# Patient Record
Sex: Male | Born: 1970
Health system: Southern US, Community
[De-identification: ages and names within clinical notes are randomized; demographics above are authoritative.]

## PROBLEM LIST (undated history)

## (undated) DIAGNOSIS — E785 Hyperlipidemia, unspecified: Secondary | ICD-10-CM

## (undated) DIAGNOSIS — R000 Tachycardia, unspecified: Secondary | ICD-10-CM

## (undated) DIAGNOSIS — F419 Anxiety disorder, unspecified: Secondary | ICD-10-CM

## (undated) DIAGNOSIS — N200 Calculus of kidney: Secondary | ICD-10-CM

## (undated) DIAGNOSIS — M1990 Unspecified osteoarthritis, unspecified site: Secondary | ICD-10-CM

## (undated) DIAGNOSIS — T7840XA Allergy, unspecified, initial encounter: Secondary | ICD-10-CM

## (undated) DIAGNOSIS — G4730 Sleep apnea, unspecified: Secondary | ICD-10-CM

## (undated) DIAGNOSIS — I10 Essential (primary) hypertension: Secondary | ICD-10-CM

## (undated) HISTORY — PX: WISDOM TOOTH EXTRACTION: SHX21

## (undated) HISTORY — DX: Hyperlipidemia, unspecified: E78.5

## (undated) HISTORY — DX: Allergy, unspecified, initial encounter: T78.40XA

## (undated) HISTORY — PX: COLONOSCOPY: SHX174

## (undated) HISTORY — DX: Sleep apnea, unspecified: G47.30

## (undated) HISTORY — DX: Tachycardia, unspecified: R00.0

## (undated) HISTORY — DX: Anxiety disorder, unspecified: F41.9

## (undated) HISTORY — DX: Essential (primary) hypertension: I10

## (undated) HISTORY — DX: Unspecified osteoarthritis, unspecified site: M19.90

---

## 2000-12-12 ENCOUNTER — Encounter: Payer: Self-pay | Admitting: Emergency Medicine

## 2000-12-12 ENCOUNTER — Emergency Department (HOSPITAL_COMMUNITY): Admission: EM | Admit: 2000-12-12 | Discharge: 2000-12-13 | Payer: Self-pay | Admitting: Emergency Medicine

## 2003-06-21 HISTORY — PX: VASECTOMY: SHX75

## 2005-11-25 ENCOUNTER — Ambulatory Visit: Payer: Self-pay | Admitting: Gastroenterology

## 2005-12-19 ENCOUNTER — Encounter (INDEPENDENT_AMBULATORY_CARE_PROVIDER_SITE_OTHER): Payer: Self-pay | Admitting: *Deleted

## 2005-12-19 ENCOUNTER — Ambulatory Visit: Payer: Self-pay | Admitting: Gastroenterology

## 2007-01-09 ENCOUNTER — Ambulatory Visit (HOSPITAL_BASED_OUTPATIENT_CLINIC_OR_DEPARTMENT_OTHER): Admission: RE | Admit: 2007-01-09 | Discharge: 2007-01-09 | Payer: Self-pay | Admitting: Internal Medicine

## 2007-01-14 ENCOUNTER — Ambulatory Visit: Payer: Self-pay | Admitting: Internal Medicine

## 2009-12-13 ENCOUNTER — Emergency Department (HOSPITAL_BASED_OUTPATIENT_CLINIC_OR_DEPARTMENT_OTHER): Admission: EM | Admit: 2009-12-13 | Discharge: 2009-12-13 | Payer: Self-pay | Admitting: Emergency Medicine

## 2010-11-02 NOTE — Procedures (Signed)
NAME:  Jacob Cabrera, Jacob Cabrera NO.:  [id number]   MEDICAL RECORD NO.:  [id number]          PATIENT TYPE:  OUT   LOCATION:  SLEEP CENTER                 FACILITY:  [facility]   PHYSICIAN:  Clinton D. Maple Hudson, MD, FCCP, FACPDATE OF BIRTH:  30-Mar-1971   DATE OF STUDY:  01/09/2007                            NOCTURNAL POLYSOMNOGRAM   REFERRING PHYSICIAN:  Jonita Albee, M.D.   INDICATION FOR STUDY:  Hypersomnia with sleep apnea.   EPWORTH SLEEPINESS SCORE:  15/24, BMI 30, weight 227 pounds.   MEDICATIONS:  Home medications are listed and reviewed. Diagnostic NPSG  protocol was requested.   SLEEP ARCHITECTURE:  Total sleep time 355 minutes with sleep efficiency  89%. Stage 1 was 4%, stage 2 was 53%, stage 3 was 23%, REM 20% of total  sleep time. Sleep latency 7 minutes, REM latency 114 minutes. Awake  after sleep onset 36 minutes. Arousal index 11.3. No bedtime medication  was taken.   RESPIRATORY DATA:  Apnea-hypopnea index (AHI,RDI) 1.5 obstructive events  per hour which is within normal limits (normal range 0-5 per hour).  There were 2 obstructive apneas and 7 hypopneas. Most events were  recorded while sleeping supine. REM AHI 0.   OXYGEN DATA:  Moderate snoring with oxygen desaturation to a nadir of  88%. Mean oxygen saturation through the study was 94% on room air.   CARDIAC DATA:  Normal sinus rhythm.   MOVEMENT-PARASOMNIA:  Rare limb jerk, insignificant.   IMPRESSIONS-RECOMMENDATIONS:  1. Unremarkable sleep architecture.  2. Occasional respiratory event, within normal limits, AHI 1.5 per      hour (normal range 0-5 per hour). Apneas and hypopneas were more      common while supine and it may be beneficial to try sleeping off      flat of back.      Clinton D. Maple Hudson, MD, [facility], FACP  Diplomate, Biomedical engineer of Sleep Medicine  Electronically Signed    CDY/MEDQ  D:  01/14/2007 10:48:40  T:  01/15/2007 07:54:00  Job:  [id number]

## 2011-03-09 ENCOUNTER — Encounter: Payer: Self-pay | Admitting: Gastroenterology

## 2011-05-16 ENCOUNTER — Encounter: Payer: Self-pay | Admitting: Gastroenterology

## 2011-05-16 ENCOUNTER — Ambulatory Visit (AMBULATORY_SURGERY_CENTER): Payer: 59 | Admitting: *Deleted

## 2011-05-16 VITALS — Ht 72.0 in | Wt 256.6 lb

## 2011-05-16 DIAGNOSIS — Z1211 Encounter for screening for malignant neoplasm of colon: Secondary | ICD-10-CM

## 2011-05-16 MED ORDER — PEG-KCL-NACL-NASULF-NA ASC-C 100 G PO SOLR: ORAL | Status: DC

## 2011-05-30 ENCOUNTER — Encounter: Payer: Self-pay | Admitting: Gastroenterology

## 2011-05-30 ENCOUNTER — Ambulatory Visit (AMBULATORY_SURGERY_CENTER): Payer: 59 | Admitting: Gastroenterology

## 2011-05-30 DIAGNOSIS — Z83719 Family history of colon polyps, unspecified: Secondary | ICD-10-CM | POA: Insufficient documentation

## 2011-05-30 DIAGNOSIS — Z8371 Family history of colonic polyps: Secondary | ICD-10-CM

## 2011-05-30 DIAGNOSIS — Z1211 Encounter for screening for malignant neoplasm of colon: Secondary | ICD-10-CM

## 2011-05-30 MED ORDER — SODIUM CHLORIDE 0.9 % IV SOLN: 500.0000 mL | INTRAVENOUS | Status: DC

## 2011-05-30 NOTE — Op Note (Signed)
[facility] [address] [place], [place]  [address]  COLONOSCOPY PROCEDURE REPORT  PATIENT:  Jacob Cabrera, Jacob Cabrera  MR#:  [id number] BIRTHDATE:  March 20, 1971, 40 yrs. old  GENDER:  male ENDOSCOPIST:  Vania Rea. Jarold Motto, MD, [facility] REF. BY: PROCEDURE DATE:  05/30/2011 PROCEDURE:  Higher-risk screening colonoscopy G0105  ASA CLASS:  Class I INDICATIONS:  family Hx of polyps MEDICATIONS:   Fentanyl 75 mcg IV, Versed 7 mg IV, These medications were titrated to patient response per physician's verbal order  DESCRIPTION OF PROCEDURE:   After the risks and benefits and of the procedure were explained, informed consent was obtained. Digital rectal exam was performed and revealed no abnormalities. The LB 180AL [id number] endoscope was introduced through the anus and advanced to the cecum, which was identified by both the appendix and ileocecal valve.  The quality of the prep was excellent, using MoviPrep.  The instrument was then slowly withdrawn as the colon was fully examined. <<PROCEDUREIMAGES>>  FINDINGS:  No polyps or cancers were seen.  This was otherwise a normal examination of the colon.   Retroflexed views in the rectum revealed no abnormalities.    The scope was then withdrawn from the patient and the procedure completed.  COMPLICATIONS:  None ENDOSCOPIC IMPRESSION: 1) No polyps or cancers 2) Otherwise normal examination RECOMMENDATIONS: 1) Given your significant family history of colon cancer, you should have a repeat colonoscopy in 5 years  REPEAT EXAM:  No  ______________________________ Vania Rea. Jarold Motto, MD, Clementeen Graham  CC:  Robert Bellow, MD  n. Rosalie Doctor:   Vania Rea. Patterson at 05/30/2011 09:24 AM  Allena Napoleon, [phone]

## 2011-05-30 NOTE — Progress Notes (Signed)
Patient did not experience any of the following events: a burn prior to discharge; a fall within the facility; wrong site/side/patient/procedure/implant event; or a hospital transfer or hospital admission upon discharge from the facility. (G8907) Patient did not have preoperative order for IV antibiotic SSI prophylaxis. (G8918)  

## 2011-05-30 NOTE — Progress Notes (Signed)
Pt tolerated the colonoscopy very well. maw 

## 2011-05-30 NOTE — Patient Instructions (Signed)
Discharge instructions given with verbal understanding.  Normal exam.  Resume previous medications. 

## 2011-05-31 ENCOUNTER — Telehealth: Payer: Self-pay | Admitting: *Deleted

## 2011-05-31 NOTE — Telephone Encounter (Signed)
No answer. Message left. 

## 2011-07-03 ENCOUNTER — Encounter (INDEPENDENT_AMBULATORY_CARE_PROVIDER_SITE_OTHER): Payer: 59 | Admitting: Physician Assistant

## 2011-07-03 DIAGNOSIS — J45909 Unspecified asthma, uncomplicated: Secondary | ICD-10-CM

## 2011-07-03 DIAGNOSIS — F458 Other somatoform disorders: Secondary | ICD-10-CM

## 2011-08-22 ENCOUNTER — Ambulatory Visit (INDEPENDENT_AMBULATORY_CARE_PROVIDER_SITE_OTHER): Payer: 59 | Admitting: Physician Assistant

## 2011-08-22 VITALS — BP 130/90 | HR 84 | Temp 98.9°F | Resp 18 | Ht 71.5 in | Wt 258.0 lb

## 2011-08-22 DIAGNOSIS — J019 Acute sinusitis, unspecified: Secondary | ICD-10-CM

## 2011-08-22 DIAGNOSIS — J9801 Acute bronchospasm: Secondary | ICD-10-CM

## 2011-08-22 DIAGNOSIS — R05 Cough: Secondary | ICD-10-CM

## 2011-08-22 DIAGNOSIS — J40 Bronchitis, not specified as acute or chronic: Secondary | ICD-10-CM

## 2011-08-22 MED ORDER — AZITHROMYCIN 250 MG PO TABS: ORAL_TABLET | ORAL | Status: AC

## 2011-08-22 MED ORDER — HYDROCODONE-HOMATROPINE 5-1.5 MG/5ML PO SYRP: ORAL_SOLUTION | ORAL | Status: AC

## 2011-08-22 MED ORDER — IPRATROPIUM BROMIDE 0.06 % NA SOLN: 2.0000 | Freq: Three times a day (TID) | NASAL | Status: DC

## 2011-08-22 MED ORDER — ALBUTEROL SULFATE (2.5 MG/3ML) 0.083% IN NEBU
2.5000 mg | INHALATION_SOLUTION | Freq: Once | RESPIRATORY_TRACT | Status: AC
  Administered 2011-08-22: 2.5 mg via RESPIRATORY_TRACT

## 2011-08-22 NOTE — Progress Notes (Signed)
Patient ID: Jacob Cabrera MRN: [id number], DOB: 1970/10/14, 41 y.o. Date of Encounter: 08/22/2011, 9:07 AM  Primary Physician: Tally Due, MD, MD  Chief Complaint:  Chief Complaint  Patient presents with  . Cough    since last wednesday  . Sore Throat  . Sinusitis    HPI: 41 y.o. year old male presents with 6 day history of nasal congestion, sinus pressure, post nasal drip, and cough. Afebrile. No chills. Nasal congestion thick and green. Cough is now productive of green sputum. Cough not associated with time of day. Bilateral hearing muffled. No GI complaints. No wheezing or SOB.  At baseline asthma is well controlled, not requiring the usage of his inhaler. With his current illness he is only needing his inhaler a couple times per day. No asthma flare.  No leg trauma, sedentary periods, h/o cancer, or tobacco use.  Past Medical History  Diagnosis Date  . Rapid heart rate   . Anxiety   . Asthma      Home Meds: Prior to Admission medications   Medication Sig Start Date End Date Taking? Authorizing Provider  Albuterol (VENTOLIN IN) Inhale into the lungs. 2 puffs before exercise    Yes Historical Provider, MD  ASMANEX 60 METERED DOSES 220 MCG/INH inhaler Inhale 1 puff into the lungs daily.  05/06/11  Yes Historical Provider, MD  metoprolol succinate (TOPROL-XL) 25 MG 24 hr tablet Take 25 mg by mouth daily.  05/06/11  Yes Historical Provider, MD  PARoxetine (PAXIL-CR) 25 MG 24 hr tablet Take 25 mg by mouth every morning.  05/06/11  Yes Historical Provider, MD    Allergies:  Allergies  Allergen Reactions  . Penicillins     Unspecified, as child    History   Social History  . Marital Status: Married    Spouse Name: N/A    Number of Children: N/A  . Years of Education: N/A   Occupational History  . Not on file.   Social History Main Topics  . Smoking status: Never Smoker   . Smokeless tobacco: Never Used  . Alcohol Use: Yes     occasional  . Drug Use: No    . Sexually Active: Not on file   Other Topics Concern  . Not on file   Social History Narrative  . No narrative on file     Review of Systems: Constitutional: negative for chills, fever, night sweats or weight changes Cardiovascular: negative for chest pain or palpitations Respiratory: negative for hemoptysis, wheezing, or shortness of breath Abdominal: negative for abdominal pain, nausea, vomiting or diarrhea Dermatological: negative for rash Neurologic: negative for headache   Physical Exam: Blood pressure 130/90, pulse 84, temperature 98.9 F (37.2 C), temperature source Oral, resp. rate 18, height 5' 11.5" (1.816 m), weight 258 lb (117.028 kg)., Body mass index is 35.48 kg/(m^2). General: Well developed, well nourished, in no acute distress. Head: Normocephalic, atraumatic, eyes without discharge, sclera non-icteric, nares are congested. Bilateral auditory canals clear, TM's are without perforation, pearly grey with reflective cone of light bilaterally. Serous effusion bilaterally behind TM's. Maxillary sinus TTP. Oral cavity moist, dentition normal. Posterior pharynx with post nasal drip and mild erythema. No peritonsillar abscess or tonsillar exudate. Neck: Supple. No thyromegaly. Full ROM. No lymphadenopathy. Lungs: Coarse breath sounds without wheezes, rales, or rhonchi. Breathing is unlabored. Heart: RRR with S1 S2. No murmurs, rubs, or gallops appreciated. Msk:  Strength and tone normal for age. Extremities: No clubbing or cyanosis. No edema. Neuro: Alert  and oriented X 3. Moves all extremities spontaneously. CNII-XII grossly in tact. Psych:  Responds to questions appropriately with a normal affect.     ASSESSMENT AND PLAN:  41 y.o. year old male with sinobronchitis and bronchospasm. -Azithromycin 250 MG #6 2 po first day then 1 po next 4 days no RF -Atrovent NS 0.06% 2 sprays each nare bid prn #1 no RF -Hycodan #4oz 1 tsp po q 4-6 hours prn cough no RF  SED -Mucinex -He does not currently need a refill of his albuterol inhaler.  -Continue albuterol inhaler q 4-6 hours prn -Tylenol/Motrin prn -Rest/fluids -RTC precautions -RTC 3-5 days if no improvement  Signed, Eula Listen, PA-C 08/22/2011 9:07 AM

## 2011-09-13 ENCOUNTER — Ambulatory Visit (INDEPENDENT_AMBULATORY_CARE_PROVIDER_SITE_OTHER): Payer: 59 | Admitting: Physician Assistant

## 2011-09-13 VITALS — BP 129/84 | HR 87 | Temp 97.9°F | Resp 16 | Ht 71.5 in | Wt 255.8 lb

## 2011-09-13 DIAGNOSIS — F411 Generalized anxiety disorder: Secondary | ICD-10-CM

## 2011-09-13 DIAGNOSIS — F419 Anxiety disorder, unspecified: Secondary | ICD-10-CM

## 2011-09-13 DIAGNOSIS — J309 Allergic rhinitis, unspecified: Secondary | ICD-10-CM

## 2011-09-13 MED ORDER — MONTELUKAST SODIUM 10 MG PO TABS: 10.0000 mg | ORAL_TABLET | Freq: Every day | ORAL | Status: DC

## 2011-09-13 MED ORDER — LORAZEPAM 1 MG PO TABS: 1.0000 mg | ORAL_TABLET | Freq: Four times a day (QID) | ORAL | Status: DC | PRN

## 2011-09-13 NOTE — Progress Notes (Signed)
Patient ID: Jacob Cabrera MRN: [id number], DOB: 1970-09-09, 41 y.o. Date of Encounter: 09/13/2011, 9:13 AM  Primary Physician: Tally Due, MD, MD  Chief Complaint: Medication refill   HPI: 41 y.o. year old male with history below presents for refill of Singulair and Ativan for up coming flight. Doing well without issues or complaints. Taking medication daily without adverse effects. Has been on medication for years. Really helps to control his allergies.  He requests Ativan for flying. He will be flying to [place] in 2 days to visit family. Gets anxious with flying stating "he is not in control." He previously taken Ativan prior to flights and done well with it. He requests a refill of the Ativan today.   He does not need any other refills today. Doing well with his anxiety at baseline. Well controled with Paxil CR.  Past Medical History  Diagnosis Date  . Rapid heart rate   . Anxiety   . Asthma      Home Meds: Prior to Admission medications   Medication Sig Start Date End Date Taking? Authorizing Provider  Albuterol (VENTOLIN IN) Inhale into the lungs. 2 puffs before exercise    Yes Historical Provider, MD  ASMANEX 60 METERED DOSES 220 MCG/INH inhaler Inhale 1 puff into the lungs daily.  05/06/11  Yes Historical Provider, MD  LORazepam (ATIVAN) 1 MG tablet Take 1 mg by mouth as needed. Take 1 tablet prior to flying   Yes Historical Provider, MD  metoprolol succinate (TOPROL-XL) 25 MG 24 hr tablet Take 25 mg by mouth daily.  05/06/11  Yes Historical Provider, MD  montelukast (SINGULAIR) 10 MG tablet Take 10 mg by mouth at bedtime.   Yes Historical Provider, MD  PARoxetine (PAXIL-CR) 25 MG 24 hr tablet Take 25 mg by mouth every morning.  05/06/11  Yes Historical Provider, MD  ipratropium (ATROVENT) 0.06 % nasal spray Place 2 sprays into the nose 3 (three) times daily. 08/22/11 08/21/12  Sondra Barges, PA-C    Allergies:  Allergies  Allergen Reactions  . Penicillins    Unspecified, as child    History   Social History  . Marital Status: Married    Spouse Name: N/A    Number of Children: N/A  . Years of Education: N/A   Occupational History  . Not on file.   Social History Main Topics  . Smoking status: Never Smoker   . Smokeless tobacco: Never Used  . Alcohol Use: Yes     occasional  . Drug Use: No  . Sexually Active: Not on file   Other Topics Concern  . Not on file   Social History Narrative  . No narrative on file     Review of Systems: Constitutional: negative for chills, fever, night sweats, weight changes, or fatigue  HEENT: negative for vision changes, hearing loss, congestion, rhinorrhea, ST, epistaxis, or sinus pressure Cardiovascular: negative for chest pain or palpitations Respiratory: negative for hemoptysis, wheezing, shortness of breath, or cough Abdominal: negative for abdominal pain, nausea, vomiting, diarrhea, or constipation Dermatological: negative for rash Neurologic: negative for headache, dizziness, or syncope All other systems reviewed and are otherwise negative with the exception to those above and in the HPI.   Physical Exam: Blood pressure 129/84, pulse 87, temperature 97.9 F (36.6 C), temperature source Oral, resp. rate 16, height 5' 11.5" (1.816 m), weight 255 lb 12.8 oz (116.03 kg)., Body mass index is 35.18 kg/(m^2). General: Well developed, well nourished, in no acute distress. Head:  Normocephalic, atraumatic, eyes without discharge, sclera non-icteric, nares are without discharge. Bilateral auditory canals clear, TM's are without perforation, pearly grey and translucent with reflective cone of light bilaterally. Oral cavity moist, posterior pharynx without exudate, erythema, peritonsillar abscess, or post nasal drip.  Neck: Supple. No thyromegaly. Full ROM. No lymphadenopathy. Lungs: Clear bilaterally to auscultation without wheezes, rales, or rhonchi. Breathing is unlabored. Heart: RRR with S1 S2. No  murmurs, rubs, or gallops appreciated. Msk:  Strength and tone normal for age. Extremities/Skin: Warm and dry. No clubbing or cyanosis. No edema. No rashes or suspicious lesions. Neuro: Alert and oriented X 3. Moves all extremities spontaneously. Gait is normal. CNII-XII grossly in tact. Psych:  Responds to questions appropriately with a normal affect.     ASSESSMENT AND PLAN:  41 y.o. year old male with allergic rhinitis and flight anxiety here for medication refill. 1. Allergic rhinitis -Refill Singulair 10 mg #30 1 po qhs RF 11  2. Flight anxiety -Refill Ativan 1 mg #30 1 po prior to flight. 1 po q 6 hours prn no RF  Signed, Eula Listen, PA-C 09/13/2011 9:13 AM

## 2011-11-18 ENCOUNTER — Ambulatory Visit (INDEPENDENT_AMBULATORY_CARE_PROVIDER_SITE_OTHER): Payer: 59 | Admitting: Family Medicine

## 2011-11-18 ENCOUNTER — Encounter: Payer: Self-pay | Admitting: Family Medicine

## 2011-11-18 VITALS — BP 128/90 | HR 80 | Temp 97.4°F | Resp 18 | Ht 71.0 in | Wt 253.0 lb

## 2011-11-18 DIAGNOSIS — I10 Essential (primary) hypertension: Secondary | ICD-10-CM | POA: Insufficient documentation

## 2011-11-18 DIAGNOSIS — E669 Obesity, unspecified: Secondary | ICD-10-CM | POA: Insufficient documentation

## 2011-11-18 DIAGNOSIS — F419 Anxiety disorder, unspecified: Secondary | ICD-10-CM | POA: Insufficient documentation

## 2011-11-18 DIAGNOSIS — E781 Pure hyperglyceridemia: Secondary | ICD-10-CM

## 2011-11-18 LAB — COMPREHENSIVE METABOLIC PANEL
Albumin: 4.7 g/dL (ref 3.5–5.2)
Alkaline Phosphatase: 56 U/L (ref 39–117)
BUN: 17 mg/dL (ref 6–23)
CO2: 24 mEq/L (ref 19–32)
Calcium: 9.4 mg/dL (ref 8.4–10.5)
Chloride: 105 mEq/L (ref 96–112)
Glucose, Bld: 86 mg/dL (ref 70–99)
Potassium: 3.8 mEq/L (ref 3.5–5.3)
Total Protein: 7.4 g/dL (ref 6.0–8.3)

## 2011-11-18 LAB — LIPID PANEL
HDL: 32 mg/dL — ABNORMAL LOW (ref >39)
Total CHOL/HDL Ratio: 5.4 Ratio
VLDL: 56 mg/dL — ABNORMAL HIGH (ref 0–40)

## 2011-11-18 MED ORDER — MOMETASONE FUROATE 220 MCG/INH IN AEPB: 1.0000 | INHALATION_SPRAY | Freq: Every day | RESPIRATORY_TRACT | Status: DC

## 2011-11-18 MED ORDER — METOPROLOL SUCCINATE ER 25 MG PO TB24: 25.0000 mg | ORAL_TABLET | Freq: Every day | ORAL | Status: DC

## 2011-11-18 MED ORDER — FLUTICASONE PROPIONATE 50 MCG/ACT NA SUSP: 2.0000 | Freq: Every day | NASAL | Status: DC

## 2011-11-18 MED ORDER — PAROXETINE HCL ER 25 MG PO TB24: 25.0000 mg | ORAL_TABLET | ORAL | Status: DC

## 2011-11-18 NOTE — Progress Notes (Signed)
  Subjective:    Patient ID: Jacob Cabrera, male    DOB: Dec 21, 1970, 41 y.o.   MRN: [id number]  HPI  This 41 y.o. Cauc male is here for med RFs and BP check. He is working out with a Psychologist, educational in an  effort to loss weight (goal=70 lbs). He is having no symptoms (ran out of Metoprolol yesterday).  Denies CP, HA, palpitations, dizziness, syncope,or weakness. He is going on vacation to beach next  week and will be scuba diving with sons.    Review of Systems As per HPI     Objective:   Physical Exam  Vitals reviewed. Constitutional: He is oriented to person, place, and time. He appears well-developed and well-nourished. No distress.  HENT:  Head: Normocephalic and atraumatic.  Eyes: EOM are normal. No scleral icterus.  Cardiovascular: Normal rate.   Pulmonary/Chest: Effort normal. No respiratory distress.  Neurological: He is alert and oriented to person, place, and time. No cranial nerve deficit. Coordination normal.  Skin: Skin is warm and dry.  Psychiatric: He has a normal mood and affect. His behavior is normal.          Assessment & Plan:   1. HTN (hypertension) - good control RFs: Metoprolol succinate 25 mg   #90   3 RFs Comprehensive metabolic panel  2. Hypertriglyceridemia  Lipid panel Continue to improve nutrition  3. Obesity  Reviewed importance of adequate Vit D for normal metabolism Vitamin D, 25-hydroxy   RFs for Fluticasone NS, Asmanex MDI, Paroxetine  RTC 4 months for CPE

## 2011-11-19 LAB — VITAMIN D 25 HYDROXY (VIT D DEFICIENCY, FRACTURES): Vit D, 25-Hydroxy: 33 ng/mL (ref 30–89)

## 2011-11-24 NOTE — Progress Notes (Signed)
Quick Note:  Please call pt and advise that the following labs are abnormal... Chemistries are normal. Triglycerides are too high and HDL("good") cholesterol is too low. Get Omega 3 Fish Oil 1200 mg 1 capsule daily will help improve HDL. Try to eliminate junk foods, fried foods and "processed" foods, eat more fruits and vegetables and stay active to help raise HDL and lower Triglycerides.  Vit D is borderline low; get OTC Vit D3 1000-2000 IU and take 1 capsules most days of the week.  Copy to pt. ______

## 2012-02-23 NOTE — Progress Notes (Signed)
This encounter was created in error - please disregard.

## 2012-03-30 ENCOUNTER — Ambulatory Visit (INDEPENDENT_AMBULATORY_CARE_PROVIDER_SITE_OTHER): Payer: 59 | Admitting: Family Medicine

## 2012-03-30 ENCOUNTER — Encounter: Payer: Self-pay | Admitting: Family Medicine

## 2012-03-30 VITALS — BP 130/84 | HR 71 | Temp 98.4°F | Resp 16 | Ht 71.0 in | Wt 251.0 lb

## 2012-03-30 DIAGNOSIS — E785 Hyperlipidemia, unspecified: Secondary | ICD-10-CM

## 2012-03-30 DIAGNOSIS — Z0000 Encounter for general adult medical examination without abnormal findings: Secondary | ICD-10-CM

## 2012-03-30 DIAGNOSIS — Z23 Encounter for immunization: Secondary | ICD-10-CM

## 2012-03-30 LAB — POCT URINALYSIS DIPSTICK
Bilirubin, UA: NEGATIVE
Ketones, UA: NEGATIVE
Nitrite, UA: NEGATIVE
Protein, UA: NEGATIVE
pH, UA: 5

## 2012-03-30 LAB — LIPID PANEL
HDL: 29 mg/dL — ABNORMAL LOW (ref >39)
LDL Cholesterol: 98 mg/dL (ref 0–99)
Total CHOL/HDL Ratio: 6 Ratio

## 2012-03-30 NOTE — Patient Instructions (Signed)
Keeping you healthy  Get these tests  Blood pressure- Have your blood pressure checked once a year by your healthcare provider.  Normal blood pressure is 120/80.  Weight- Have your body mass index (BMI) calculated to screen for obesity.  BMI is a measure of body fat based on height and weight. You can also calculate your own BMI at https://www.west-esparza.com/.  Cholesterol- Have your cholesterol checked regularly starting at age 41, sooner may be necessary if you have diabetes, high blood pressure, if a family member developed heart diseases at an early age or if you smoke.   Chlamydia, HIV, and other sexual transmitted disease- Get screened each year until the age of 3 then within three months of each new sexual partner.  Diabetes- Have your blood sugar checked regularly if you have high blood pressure, high cholesterol, a family history of diabetes or if you are overweight.  Get these vaccines  Flu shot- Every fall.  Tetanus shot- Every 10 years.  Menactra- Single dose; prevents meningitis.  Take these steps  Don't smoke- If you do smoke, ask your healthcare provider about quitting. For tips on how to quit, go to www.smokefree.gov or call 1-800-QUIT-NOW.  Be physically active- Exercise 5 days a week for at least 30 minutes.  If you are not already physically active start slow and gradually work up to 30 minutes of moderate physical activity.  Examples of moderate activity include walking briskly, mowing the yard, dancing, swimming bicycling, etc.  Eat a healthy diet- Eat a variety of healthy foods such as fruits, vegetables, low fat milk, low fat cheese, yogurt, lean meats, poultry, fish, beans, tofu, etc.  For more information on healthy eating, go to www.thenutritionsource.org  Drink alcohol in moderation- Limit alcohol intake two drinks or less a day.  Never drink and drive.  Dentist- Brush and floss teeth twice daily; visit your dentis twice a year.  Depression-Your emotional  health is as important as your physical health.  If you're feeling down, losing interest in things you normally enjoy please talk with your healthcare provider.  Gun Safety- If you keep a gun in your home, keep it unloaded and with the safety lock on.  Bullets should be stored separately.  Helmet use- Always wear a helmet when riding a motorcycle, bicycle, rollerblading or skateboarding.  Safe sex- If you may be exposed to a sexually transmitted infection, use a condom  Seat belts- Seat bels can save your life; always wear one.  Smoke/Carbon Monoxide detectors- These detectors need to be installed on the appropriate level of your home.  Replace batteries at least once a year.  Skin Cancer- When out in the sun, cover up and use sunscreen SPF 15 or higher.  Violence- If anyone is threatening or hurting you, please tell your healthcare provider.    Hypertriglyceridemia  Diet for High blood levels of Triglycerides Most fats in food are triglycerides. Triglycerides in your blood are stored as fat in your body. High levels of triglycerides in your blood may put you at a greater risk for heart disease and stroke.  Normal triglyceride levels are less than 150 mg/dL. Borderline high levels are 150-199 mg/dl. High levels are 200 - 499 mg/dL, and very high triglyceride levels are greater than 500 mg/dL. The decision to treat high triglycerides is generally based on the level. For people with borderline or high triglyceride levels, treatment includes weight loss and exercise. Drugs are recommended for people with very high triglyceride levels. Many people who need  treatment for high triglyceride levels have metabolic syndrome. This syndrome is a collection of disorders that often include: insulin resistance, high blood pressure, blood clotting problems, high cholesterol and triglycerides. TESTING PROCEDURE FOR TRIGLYCERIDES  You should not eat 4 hours before getting your triglycerides measured. The  normal range of triglycerides is between 10 and 250 milligrams per deciliter (mg/dl). Some people may have extreme levels (1000 or above), but your triglyceride level may be too high if it is above 150 mg/dl, depending on what other risk factors you have for heart disease.  People with high blood triglycerides may also have high blood cholesterol levels. If you have high blood cholesterol as well as high blood triglycerides, your risk for heart disease is probably greater than if you only had high triglycerides. High blood cholesterol is one of the main risk factors for heart disease. CHANGING YOUR DIET  Your weight can affect your blood triglyceride level. If you are more than 20% above your ideal body weight, you may be able to lower your blood triglycerides by losing weight. Eating less and exercising regularly is the best way to combat this. Fat provides more calories than any other food. The best way to lose weight is to eat less fat. Only 30% of your total calories should come from fat. Less than 7% of your diet should come from saturated fat. A diet low in fat and saturated fat is the same as a diet to decrease blood cholesterol. By eating a diet lower in fat, you may lose weight, lower your blood cholesterol, and lower your blood triglyceride level.  Eating a diet low in fat, especially saturated fat, may also help you lower your blood triglyceride level. Ask your dietitian to help you figure how much fat you can eat based on the number of calories your caregiver has prescribed for you.  Exercise, in addition to helping with weight loss may also help lower triglyceride levels.   Alcohol can increase blood triglycerides. You may need to stop drinking alcoholic beverages.  Too much carbohydrate in your diet may also increase your blood triglycerides. Some complex carbohydrates are necessary in your diet. These may include bread, rice, potatoes, other starchy vegetables and cereals.  Reduce "simple"  carbohydrates. These may include pure sugars, candy, honey, and jelly without losing other nutrients. If you have the kind of high blood triglycerides that is affected by the amount of carbohydrates in your diet, you will need to eat less sugar and less high-sugar foods. Your caregiver can help you with this.  Adding 2-4 grams of fish oil (EPA+ DHA) may also help lower triglycerides. Speak with your caregiver before adding any supplements to your regimen. Following the Diet  Maintain your ideal weight. Your caregivers can help you with a diet. Generally, eating less food and getting more exercise will help you lose weight. Joining a weight control group may also help. Ask your caregivers for a good weight control group in your area.  Eat low-fat foods instead of high-fat foods. This can help you lose weight too.  These foods are lower in fat. Eat MORE of these:   Dried beans, peas, and lentils.  Egg whites.  Low-fat cottage cheese.  Fish.  Lean cuts of meat, such as round, sirloin, rump, and flank (cut extra fat off meat you fix).  Whole grain breads, cereals and pasta.  Skim and nonfat dry milk.  Low-fat yogurt.  Poultry without the skin.  Cheese made with skim or part-skim milk,  such as mozzarella, parmesan, farmers', ricotta, or pot cheese. These are higher fat foods. Eat LESS of these:   Whole milk and foods made from whole milk, such as American, blue, cheddar, monterey jack, and swiss cheese  High-fat meats, such as luncheon meats, sausages, knockwurst, bratwurst, hot dogs, ribs, corned beef, ground pork, and regular ground beef.  Fried foods. Limit saturated fats in your diet. Substituting unsaturated fat for saturated fat may decrease your blood triglyceride level. You will need to read package labels to know which products contain saturated fats.  These foods are high in saturated fat. Eat LESS of these:   Fried pork skins.  Whole milk.  Skin and fat from  poultry.  Palm oil.  Butter.  Shortening.  Cream cheese.  Tomasa Blase.  Margarines and baked goods made from listed oils.  Vegetable shortenings.  Chitterlings.  Fat from meats.  Coconut oil.  Palm kernel oil.  Lard.  Cream.  Sour cream.  Fatback.  Coffee whiteners and non-dairy creamers made with these oils.  Cheese made from whole milk. Use unsaturated fats (both polyunsaturated and monounsaturated) moderately. Remember, even though unsaturated fats are better than saturated fats; you still want a diet low in total fat.  These foods are high in unsaturated fat:   Canola oil.  Sunflower oil.  Mayonnaise.  Almonds.  Peanuts.  Pine nuts.  Margarines made with these oils.  Safflower oil.  Olive oil.  Avocados.  Cashews.  Peanut butter.  Sunflower seeds.  Soybean oil.  Peanut oil.  Olives.  Pecans.  Walnuts.  Pumpkin seeds. Avoid sugar and other high-sugar foods. This will decrease carbohydrates without decreasing other nutrients. Sugar in your food goes rapidly to your blood. When there is excess sugar in your blood, your liver may use it to make more triglycerides. Sugar also contains calories without other important nutrients.  Eat LESS of these:   Sugar, brown sugar, powdered sugar, jam, jelly, preserves, honey, syrup, molasses, pies, candy, cakes, cookies, frosting, pastries, colas, soft drinks, punches, fruit drinks, and regular gelatin.  Avoid alcohol. Alcohol, even more than sugar, may increase blood triglycerides. In addition, alcohol is high in calories and low in nutrients. Ask for sparkling water, or a diet soft drink instead of an alcoholic beverage. Suggestions for planning and preparing meals   Bake, broil, grill or roast meats instead of frying.  Remove fat from meats and skin from poultry before cooking.  Add spices, herbs, lemon juice or vinegar to vegetables instead of salt, rich sauces or gravies.  Use a non-stick  skillet without fat or use no-stick sprays.  Cool and refrigerate stews and broth. Then remove the hardened fat floating on the surface before serving.  Refrigerate meat drippings and skim off fat to make low-fat gravies.  Serve more fish.  Use less butter, margarine and other high-fat spreads on bread or vegetables.  Use skim or reconstituted non-fat dry milk for cooking.  Cook with low-fat cheeses.  Substitute low-fat yogurt or cottage cheese for all or part of the sour cream in recipes for sauces, dips or congealed salads.  Use half yogurt/half mayonnaise in salad recipes.  Substitute evaporated skim milk for cream. Evaporated skim milk or reconstituted non-fat dry milk can be whipped and substituted for whipped cream in certain recipes.  Choose fresh fruits for dessert instead of high-fat foods such as pies or cakes. Fruits are naturally low in fat. When Dining Out   Order low-fat appetizers such as fruit or vegetable juice,  pasta with vegetables or tomato sauce.  Select clear, rather than cream soups.  Ask that dressings and gravies be served on the side. Then use less of them.  Order foods that are baked, broiled, poached, steamed, stir-fried, or roasted.  Ask for margarine instead of butter, and use only a small amount.  Drink sparkling water, unsweetened tea or coffee, or diet soft drinks instead of alcohol or other sweet beverages. QUESTIONS AND ANSWERS ABOUT OTHER FATS IN THE BLOOD: SATURATED FAT, TRANS FAT, AND CHOLESTEROL What is trans fat? Trans fat is a type of fat that is formed when vegetable oil is hardened through a process called hydrogenation. This process helps makes foods more solid, gives them shape, and prolongs their shelf life. Trans fats are also called hydrogenated or partially hydrogenated oils.  What do saturated fat, trans fat, and cholesterol in foods have to do with heart disease? Saturated fat, trans fat, and cholesterol in the diet all raise  the level of LDL "bad" cholesterol in the blood. The higher the LDL cholesterol, the greater the risk for coronary heart disease (CHD). Saturated fat and trans fat raise LDL similarly.  What foods contain saturated fat, trans fat, and cholesterol? High amounts of saturated fat are found in animal products, such as fatty cuts of meat, chicken skin, and full-fat dairy products like butter, whole milk, cream, and cheese, and in tropical vegetable oils such as palm, palm kernel, and coconut oil. Trans fat is found in some of the same foods as saturated fat, such as vegetable shortening, some margarines (especially hard or stick margarine), crackers, cookies, baked goods, fried foods, salad dressings, and other processed foods made with partially hydrogenated vegetable oils. Small amounts of trans fat also occur naturally in some animal products, such as milk products, beef, and lamb. Foods high in cholesterol include liver, other organ meats, egg yolks, shrimp, and full-fat dairy products. How can I use the new food label to make heart-healthy food choices? Check the Nutrition Facts panel of the food label. Choose foods lower in saturated fat, trans fat, and cholesterol. For saturated fat and cholesterol, you can also use the Percent Daily Value (%DV): 5% DV or less is low, and 20% DV or more is high. (There is no %DV for trans fat.) Use the Nutrition Facts panel to choose foods low in saturated fat and cholesterol, and if the trans fat is not listed, read the ingredients and limit products that list shortening or hydrogenated or partially hydrogenated vegetable oil, which tend to be high in trans fat. POINTS TO REMEMBER:   Discuss your risk for heart disease with your caregivers, and take steps to reduce risk factors.  Change your diet. Choose foods that are low in saturated fat, trans fat, and cholesterol.  Add exercise to your daily routine if it is not already being done. Participate in physical activity  of moderate intensity, like brisk walking, for at least 30 minutes on most, and preferably all days of the week. No time? Break the 30 minutes into three, 10-minute segments during the day.  Stop smoking. If you do smoke, contact your caregiver to discuss ways in which they can help you quit.  Do not use street drugs.  Maintain a normal weight.  Maintain a healthy blood pressure.  Keep up with your blood work for checking the fats in your blood as directed by your caregiver. Document Released: 03/24/2004 Document Revised: 12/06/2011 Document Reviewed: 10/20/2008 [employer] [facility] Patient Information 2013 [place], [place].

## 2012-04-01 NOTE — Progress Notes (Signed)
Quick Note:  Please call pt and advise that the following labs are abnormal... Lipid profile shows triglycerides have come down from 280 to 234. HDL (:good") cholesterol is still below 39. All other values are very good. Given your numbers, your risk of Cardiovascular Disease is a little above average. Get OTC Fish Oil supplement (Schiff MegaRed Krill Oil Omega-3 capsule is a good brand) and take 1 capsule daily, separate from your other medications. Continue regular exercise and carefully review the information you received at you visit re: Hypertriglycerides.  These values can be rechecked at your next Blood Pressure follow-up visit.  Copy to pt.   ______

## 2012-04-01 NOTE — Progress Notes (Signed)
  Subjective:    Patient ID: Jacob Cabrera, male    DOB: [birth year], 41 y.o.   MRN: [id number]  HPI  This 41 y.o. Cauc male is here for CPE and medication refills. He is doing well on current chronic   medications and has no side effects or new complaints. He is married and works for an [employer].  He exercises 3x / week for 45 minutes.    Review of Systems Noncontirbutory.     Objective:   Physical Exam  Nursing note and vitals reviewed. Constitutional: He is oriented to person, place, and time. He appears well-developed and well-nourished. No distress.  HENT:  Head: Normocephalic and atraumatic.  Right Ear: Hearing, tympanic membrane, external ear and ear canal normal.  Left Ear: Hearing, tympanic membrane, external ear and ear canal normal.  Nose: Nose normal. No nasal deformity or septal deviation.  Mouth/Throat: Uvula is midline, oropharynx is clear and moist and mucous membranes are normal. No oral lesions. Normal dentition. No dental caries.  Eyes: Conjunctivae normal, EOM and lids are normal. Pupils are equal, round, and reactive to light. No scleral icterus.  Fundoscopic exam:      The right eye shows no papilledema. The right eye shows red reflex.      The left eye shows no papilledema. The left eye shows red reflex. Neck: Normal range of motion. Neck supple. No thyromegaly present.  Cardiovascular: Normal rate, regular rhythm, normal heart sounds and intact distal pulses.  Exam reveals no gallop and no friction rub.   No murmur heard. Pulmonary/Chest: Effort normal and breath sounds normal. No respiratory distress. He has no wheezes.  Abdominal: Soft. Normal appearance and bowel sounds are normal. He exhibits no distension, no pulsatile midline mass and no mass. There is no hepatosplenomegaly. There is no tenderness. There is no guarding and no CVA tenderness. No hernia.  Genitourinary: Testes normal and penis normal. Right testis shows no mass, no swelling and no tenderness.  Right testis is descended. Left testis shows no mass, no swelling and no tenderness. Left testis is descended.  Musculoskeletal: Normal range of motion. He exhibits no edema and no tenderness.  Lymphadenopathy:    He has no cervical adenopathy.       Right: No inguinal adenopathy present.       Left: No inguinal adenopathy present.  Neurological: He is alert and oriented to person, place, and time. He has normal reflexes. No cranial nerve deficit. He exhibits normal muscle tone. Coordination normal.  Skin: Skin is warm and dry. No rash noted. No pallor.  Psychiatric: He has a normal mood and affect. His behavior is normal. Judgment and thought content normal.          Assessment & Plan:   1. Routine general medical examination at a health care facility  POCT urinalysis dipstick  2. Dyslipidemia  Lipid panel  3. Need for prophylactic vaccination and inoculation against influenza  Flu vaccine greater than or equal to 3yo preservative free IM

## 2012-05-08 ENCOUNTER — Ambulatory Visit (INDEPENDENT_AMBULATORY_CARE_PROVIDER_SITE_OTHER): Payer: 59 | Admitting: Family Medicine

## 2012-05-08 VITALS — BP 116/88 | HR 79 | Temp 98.7°F | Resp 16 | Ht 71.0 in | Wt 258.0 lb

## 2012-05-08 DIAGNOSIS — L42 Pityriasis rosea: Secondary | ICD-10-CM

## 2012-05-08 DIAGNOSIS — R21 Rash and other nonspecific skin eruption: Secondary | ICD-10-CM

## 2012-05-08 NOTE — Progress Notes (Signed)
[facility] [address], [place] [address] [phone]  Date:  05/08/2012   Name:  Jacob Cabrera   DOB:  05/12/1971   MRN:  [id number]  PCP:  Tally Due, MD    Chief Complaint: Rash   History of Present Illness:  Jacob Cabrera is a 41 y.o. very pleasant male patient who presents with the following:  He is here today to evaluate a rash. It started 2 or 3 weeks ago. He did note one spot on his trunk that appeared about a week prior to the rest of the rash.  The rash will occasionally itch, but is otherwise not bothersome.  It is over his anterior trunk, and he also has some spots on his proximal arms and proximal thighs.    He has felt well overall, no fever or chills. No other symptoms.  He has never had this before He has no new medications, no exposure to new foods, soaps, or foods.   There are no risk factors for HIV or syphilis- he does not use IV drugs and is married.    Patient Active Problem List  Diagnosis  . Special screening for malignant neoplasms, colon  . FH: colon polyps  . Obesity (BMI 30-39.9)  . Hypertriglyceridemia  . HTN (hypertension)  . Anxiety disorder    Past Medical History  Diagnosis Date  . Rapid heart rate   . Anxiety   . Asthma     Past Surgical History  Procedure Date  . Vasectomy 2005    History  Substance Use Topics  . Smoking status: Never Smoker   . Smokeless tobacco: Never Used  . Alcohol Use: Yes     Comment: occasional    Family History  Problem Relation Age of Onset  . Colon polyps Mother 33  . Colon cancer Maternal Grandfather 19  . Memory loss Father   . Dementia Paternal Grandfather     Allergies  Allergen Reactions  . Penicillins     Unspecified, as child    Medication list has been reviewed and updated.  Current Outpatient Prescriptions on File Prior to Visit  Medication Sig Dispense Refill  . Albuterol (VENTOLIN IN) Inhale into the lungs. 2 puffs before exercise       .  LORazepam (ATIVAN) 1 MG tablet Take 1 tablet (1 mg total) by mouth every 6 (six) hours as needed. Take 1 tablet prior to flying  30 tablet  0  . metoprolol succinate (TOPROL-XL) 25 MG 24 hr tablet Take 1 tablet (25 mg total) by mouth daily.  90 tablet  3  . mometasone (ASMANEX 60 METERED DOSES) 220 MCG/INH inhaler Inhale 1 puff into the lungs daily.  1 Inhaler  11  . PARoxetine (PAXIL-CR) 25 MG 24 hr tablet Take 1 tablet (25 mg total) by mouth every morning.  30 tablet  5  . fluticasone (FLONASE) 50 MCG/ACT nasal spray Place 2 sprays into the nose daily.  16 g  6  . ipratropium (ATROVENT) 0.06 % nasal spray Place 2 sprays into the nose 3 (three) times daily.  15 mL  0  . montelukast (SINGULAIR) 10 MG tablet Take 1 tablet (10 mg total) by mouth at bedtime.  30 tablet  11    Review of Systems:  As per HPI- otherwise negative.   Physical Examination: Filed Vitals:   05/08/12 0915  BP: 116/88  Pulse: 79  Temp: 98.7 F (37.1 C)  Resp: 16  Filed Vitals:   05/08/12 0915  Height: 5\' 11"  (1.803 m)  Weight: 258 lb (117.028 kg)   Body mass index is 35.98 kg/(m^2). Ideal Body Weight: Weight in (lb) to have BMI = 25: 178.9    GEN: WDWN, NAD, Non-toxic, Alert & Oriented x 3 HEENT: Atraumatic, Normocephalic.  Bilateral TM wnl, oropharynx normal.  PEERL,EOMI.   No oral lesions Ears and Nose: No external deformity. EXTR: No clubbing/cyanosis/edema NEURO: Normal gait.  PSYCH: Normally interactive. Conversant. Not depressed or anxious appearing.  Calm demeanor.  He has a typical pityriasis roscea rash over his anterior trunk/ chest.  He indicates the herald patch.  He has some other lesions over his proximal arms.  Hands and feet are spared, as are distal arms/ legs.  The rash is macular, pink colored.     Assessment and Plan: 1. Rash   2. Pityriasis rosea    Classic pityriasis roscea.  See pt instructions.  He is to let me know if any other symptoms develop such as fever, chills or  malaise.    Abbe Amsterdam, MD

## 2012-05-08 NOTE — Patient Instructions (Addendum)
Pityriasis Rosea Pityriasis rosea is a rash which is probably caused by a virus. It generally starts as a scaly, red patch on the trunk (the area of the body that a t-shirt would cover) but does not appear on sun exposed areas. The rash is usually preceded by an initial larger spot called the "herald patch" a week or more before the rest of the rash appears. Generally within one to two days the rash appears rapidly on the trunk, upper arms, and sometimes the upper legs. The rash usually appears as flat, oval patches of scaly pink color. The rash can also be raised and one is able to feel it with a finger. The rash can also be finely crinkled and may slough off leaving a ring of scale around the spot. Sometimes a mild sore throat is present with the rash. It usually affects children and young adults in the spring and autumn. Women are more frequently affected than men. TREATMENT  Pityriasis rosea is a self-limited condition. This means it goes away within 4 to 8 weeks without treatment. The spots may persist for several months, especially in darker-colored skin after the rash has resolved and healed. Benadryl and steroid creams may be used if itching is a problem. SEEK MEDICAL CARE IF:   Your rash does not go away or persists longer than three months.  You develop fever and joint pain.  You develop severe headache and confusion.  You develop breathing difficulty, vomiting and/or extreme weakness. Document Released: 07/13/2001 Document Revised: 08/29/2011 Document Reviewed: 08/01/2008 [employer] Patient Information 2013 [employer].  

## 2012-05-10 ENCOUNTER — Telehealth: Payer: Self-pay | Admitting: *Deleted

## 2012-05-10 NOTE — Telephone Encounter (Signed)
Pharmacy requesting refill on asmanex twisthaler (60) .

## 2012-05-11 NOTE — Telephone Encounter (Signed)
Looks like he was given 11 refills at the end of May, has he used them all?

## 2012-05-12 NOTE — Telephone Encounter (Signed)
Called pharmacy left message to advise

## 2012-05-15 ENCOUNTER — Other Ambulatory Visit: Payer: Self-pay | Admitting: Family Medicine

## 2012-09-07 ENCOUNTER — Emergency Department (HOSPITAL_COMMUNITY)
Admission: EM | Admit: 2012-09-07 | Discharge: 2012-09-07 | Disposition: A | Discharge status: Home / Self Care | Payer: 59 | Attending: Emergency Medicine | Admitting: Emergency Medicine

## 2012-09-07 ENCOUNTER — Emergency Department (HOSPITAL_COMMUNITY): Payer: 59

## 2012-09-07 ENCOUNTER — Encounter (HOSPITAL_COMMUNITY): Payer: Self-pay | Admitting: Emergency Medicine

## 2012-09-07 DIAGNOSIS — N200 Calculus of kidney: Secondary | ICD-10-CM | POA: Insufficient documentation

## 2012-09-07 DIAGNOSIS — Z8679 Personal history of other diseases of the circulatory system: Secondary | ICD-10-CM | POA: Insufficient documentation

## 2012-09-07 DIAGNOSIS — Z79899 Other long term (current) drug therapy: Secondary | ICD-10-CM | POA: Insufficient documentation

## 2012-09-07 DIAGNOSIS — F411 Generalized anxiety disorder: Secondary | ICD-10-CM | POA: Insufficient documentation

## 2012-09-07 DIAGNOSIS — IMO0002 Reserved for concepts with insufficient information to code with codable children: Secondary | ICD-10-CM | POA: Insufficient documentation

## 2012-09-07 DIAGNOSIS — R110 Nausea: Secondary | ICD-10-CM | POA: Insufficient documentation

## 2012-09-07 DIAGNOSIS — J45909 Unspecified asthma, uncomplicated: Secondary | ICD-10-CM | POA: Insufficient documentation

## 2012-09-07 DIAGNOSIS — Z9852 Vasectomy status: Secondary | ICD-10-CM | POA: Insufficient documentation

## 2012-09-07 LAB — URINALYSIS, ROUTINE W REFLEX MICROSCOPIC
Ketones, ur: NEGATIVE mg/dL
Leukocytes, UA: NEGATIVE
Protein, ur: NEGATIVE mg/dL
Urobilinogen, UA: 0.2 mg/dL (ref 0.0–1.0)

## 2012-09-07 LAB — URINE MICROSCOPIC-ADD ON

## 2012-09-07 MED ORDER — ONDANSETRON HCL 4 MG/2ML IJ SOLN
4.0000 mg | Freq: Once | INTRAMUSCULAR | Status: AC
  Administered 2012-09-07: 4 mg via INTRAVENOUS
  Filled 2012-09-07: qty 2

## 2012-09-07 MED ORDER — HYDROMORPHONE HCL PF 1 MG/ML IJ SOLN
1.0000 mg | Freq: Once | INTRAMUSCULAR | Status: AC
  Administered 2012-09-07: 1 mg via INTRAVENOUS
  Filled 2012-09-07: qty 1

## 2012-09-07 MED ORDER — KETOROLAC TROMETHAMINE 30 MG/ML IJ SOLN
30.0000 mg | Freq: Once | INTRAMUSCULAR | Status: AC
  Administered 2012-09-07: 30 mg via INTRAVENOUS
  Filled 2012-09-07: qty 1

## 2012-09-07 MED ORDER — ONDANSETRON HCL 4 MG PO TABS: 4.0000 mg | ORAL_TABLET | Freq: Four times a day (QID) | ORAL | Status: DC

## 2012-09-07 MED ORDER — TAMSULOSIN HCL 0.4 MG PO CAPS: 0.4000 mg | ORAL_CAPSULE | Freq: Every day | ORAL | Status: DC

## 2012-09-07 MED ORDER — OXYCODONE-ACETAMINOPHEN 5-325 MG PO TABS: 2.0000 | ORAL_TABLET | ORAL | Status: DC | PRN

## 2012-09-07 MED ORDER — SODIUM CHLORIDE 0.9 % IV BOLUS (SEPSIS)
1000.0000 mL | Freq: Once | INTRAVENOUS | Status: AC
  Administered 2012-09-07: 1000 mL via INTRAVENOUS

## 2012-09-07 NOTE — ED Notes (Signed)
Pt states he is having pain in his right flank area that started about 30 mins ago and woke him up  Pt states the pain is in his back  Pt is pacing in triage  Nausea without vomiting  Color pale and sweaty

## 2012-09-07 NOTE — ED Provider Notes (Signed)
History     CSN: [id number]  Arrival date & time 09/07/12  0239   First MD Initiated Contact with Patient 09/07/12 0259      Chief Complaint  Patient presents with  . Flank Pain    (Consider location/radiation/quality/duration/timing/severity/associated sxs/prior treatment) HPI Hx per PT, R flank pain, sudden onset severe pain.  Started about 45 min PTA.  No h/o same, no trauma, no f/C, no dysuria, no hematuria. Severe sharp pain not radiating, unable to get comfortable no known alleviating factors.  Past Medical History  Diagnosis Date  . Rapid heart rate   . Anxiety   . Asthma     Past Surgical History  Procedure Laterality Date  . Vasectomy  2005    Family History  Problem Relation Age of Onset  . Colon polyps Mother 9  . Colon cancer Maternal Grandfather 41  . Memory loss Father   . Dementia Paternal Grandfather     History  Substance Use Topics  . Smoking status: Never Smoker   . Smokeless tobacco: Never Used  . Alcohol Use: Yes     Comment: occasional      Review of Systems  Constitutional: Negative for fever and chills.  HENT: Negative for neck pain and neck stiffness.   Eyes: Negative for pain.  Respiratory: Negative for shortness of breath.   Cardiovascular: Negative for chest pain.  Gastrointestinal: Positive for nausea. Negative for vomiting and abdominal pain.  Genitourinary: Positive for flank pain. Negative for dysuria.  Musculoskeletal: Negative for back pain.  Skin: Negative for rash.  Neurological: Negative for headaches.  All other systems reviewed and are negative.    Allergies  Penicillins  Home Medications   Current Outpatient Rx  Name  Route  Sig  Dispense  Refill  . Albuterol (VENTOLIN IN)   Inhalation   Inhale into the lungs. 2 puffs before exercise          . fluticasone (FLONASE) 50 MCG/ACT nasal spray   Nasal   Place 2 sprays into the nose daily.   16 g   6   . EXPIRED: ipratropium (ATROVENT) 0.06 % nasal  spray   Nasal   Place 2 sprays into the nose 3 (three) times daily.   15 mL   0   . LORazepam (ATIVAN) 1 MG tablet   Oral   Take 1 tablet (1 mg total) by mouth every 6 (six) hours as needed. Take 1 tablet prior to flying   30 tablet   0   . metoprolol succinate (TOPROL-XL) 25 MG 24 hr tablet   Oral   Take 1 tablet (25 mg total) by mouth daily.   90 tablet   3   . mometasone (ASMANEX 60 METERED DOSES) 220 MCG/INH inhaler   Inhalation   Inhale 1 puff into the lungs daily.   1 Inhaler   11   . montelukast (SINGULAIR) 10 MG tablet   Oral   Take 1 tablet (10 mg total) by mouth at bedtime.   30 tablet   11   . PARoxetine (PAXIL-CR) 25 MG 24 hr tablet      TAKE 1 TABLET (25 MG TOTAL) BY MOUTH EVERY MORNING.   30 tablet   5     BP 130/83  Pulse 75  Temp(Src) 98.7 F (37.1 C) (Oral)  Resp 20  Wt 246 lb (111.585 kg)  BMI 34.33 kg/m2  SpO2 100%  Physical Exam  Constitutional: He is oriented to person, place, and time.  He appears well-developed and well-nourished.  HENT:  Head: Normocephalic and atraumatic.  Eyes: EOM are normal. Pupils are equal, round, and reactive to light.  Neck: Neck supple.  Cardiovascular: Normal rate, regular rhythm and intact distal pulses.   Pulmonary/Chest: Effort normal and breath sounds normal. No respiratory distress.  Abdominal: Soft. Bowel sounds are normal. He exhibits no distension. There is no rebound.  TTP over R flank, neg Murphy's sign  Musculoskeletal: Normal range of motion. He exhibits no edema.  Neurological: He is alert and oriented to person, place, and time.  Skin: Skin is warm and dry.    ED Course  Procedures (including critical care time)  Results for orders placed during the hospital encounter of 09/07/12  URINALYSIS, ROUTINE W REFLEX MICROSCOPIC      Result Value Range   Color, Urine YELLOW  YELLOW   APPearance HAZY (*) CLEAR   Specific Gravity, Urine 1.032 (*) 1.005 - 1.030   pH 5.0  5.0 - 8.0   Glucose,  UA NEGATIVE  NEGATIVE mg/dL   Hgb urine dipstick LARGE (*) NEGATIVE   Bilirubin Urine NEGATIVE  NEGATIVE   Ketones, ur NEGATIVE  NEGATIVE mg/dL   Protein, ur NEGATIVE  NEGATIVE mg/dL   Urobilinogen, UA 0.2  0.0 - 1.0 mg/dL   Nitrite NEGATIVE  NEGATIVE   Leukocytes, UA NEGATIVE  NEGATIVE  URINE MICROSCOPIC-ADD ON      Result Value Range   Squamous Epithelial / LPF RARE  RARE   WBC, UA 0-2  <3 WBC/hpf   RBC / HPF 11-20  <3 RBC/hpf   Urine-Other MUCOUS PRESENT     Ct Abdomen Pelvis Wo Contrast  09/07/2012  *RADIOLOGY REPORT*  Clinical Data: Right flank pain starting about 30 minutes ago. Nausea.  CT ABDOMEN AND PELVIS WITHOUT CONTRAST  Technique:  Multidetector CT imaging of the abdomen and pelvis was performed following the standard protocol without intravenous contrast.  Comparison: None.  Findings: Focal scarring in the anterior left lung base.  There is mild right-sided pyelocaliectasis and ureterectasis down to the level of the distal ureter where there is a tiny stone at the ureterovesicle junction measuring about 2 mm diameter.  There is mild periureteral stranding.  No pyelocaliectasis or ureterectasis on the left side.  No left renal or ureteral stones.  The unenhanced appearance of the liver, spleen, gallbladder, pancreas, adrenal glands, inferior vena cava, abdominal aorta, and retroperitoneal lymph nodes is unremarkable.  Prominent visceral adipose tissues.  Stomach, small bowel, and colon are not abnormally distended.  No free air or free fluid in the abdomen.  Pelvis:  Diverticula in the sigmoid colon without diverticulitis. The prostate gland is not enlarged.  The bladder is decompressed. The appendix is normal.  No free or loculated pelvic fluid collections.  No significant pelvic lymphadenopathy.  Mild degenerative changes in the lumbar spine.  IMPRESSION: 2 mm diameter stone in the distal right ureter with mild proximal obstruction.   Original Report Authenticated By: Burman Nieves,  M.D.     IV Dilaudid, zofran, toradol and IV NS   5:04 AM pain resolved but now starting to return - dilaudid repeated, plan strain all urine, urology referral, RX percocet and zofran and flomax with outpatient follow up  MDM  Flank pain, kidney stone  CT, UA  IV narcotics  VS and nursing notes reviewed        Sunnie Nielsen, MD 09/07/12 0505

## 2012-09-08 ENCOUNTER — Emergency Department (HOSPITAL_COMMUNITY)
Admission: EM | Admit: 2012-09-08 | Discharge: 2012-09-08 | Disposition: A | Discharge status: Home / Self Care | Payer: 59 | Attending: Emergency Medicine | Admitting: Emergency Medicine

## 2012-09-08 ENCOUNTER — Emergency Department (HOSPITAL_COMMUNITY): Payer: 59

## 2012-09-08 DIAGNOSIS — IMO0002 Reserved for concepts with insufficient information to code with codable children: Secondary | ICD-10-CM | POA: Insufficient documentation

## 2012-09-08 DIAGNOSIS — N200 Calculus of kidney: Secondary | ICD-10-CM | POA: Insufficient documentation

## 2012-09-08 DIAGNOSIS — Z87442 Personal history of urinary calculi: Secondary | ICD-10-CM

## 2012-09-08 DIAGNOSIS — F411 Generalized anxiety disorder: Secondary | ICD-10-CM | POA: Insufficient documentation

## 2012-09-08 DIAGNOSIS — J45909 Unspecified asthma, uncomplicated: Secondary | ICD-10-CM | POA: Insufficient documentation

## 2012-09-08 DIAGNOSIS — Z79899 Other long term (current) drug therapy: Secondary | ICD-10-CM | POA: Insufficient documentation

## 2012-09-08 LAB — CBC WITH DIFFERENTIAL/PLATELET
Basophils Absolute: 0 10*3/uL (ref 0.0–0.1)
Eosinophils Relative: 1 % (ref 0–5)
HCT: 42.4 % (ref 39.0–52.0)
Hemoglobin: 14.8 g/dL (ref 13.0–17.0)
Lymphocytes Relative: 20 % (ref 12–46)
MCV: 85.5 fL (ref 78.0–100.0)
Monocytes Absolute: 0.8 10*3/uL (ref 0.1–1.0)
Monocytes Relative: 7 % (ref 3–12)
RDW: 12.3 % (ref 11.5–15.5)
WBC: 12.1 10*3/uL — ABNORMAL HIGH (ref 4.0–10.5)

## 2012-09-08 LAB — URINALYSIS, ROUTINE W REFLEX MICROSCOPIC
Bilirubin Urine: NEGATIVE
Glucose, UA: NEGATIVE mg/dL
Ketones, ur: NEGATIVE mg/dL
Protein, ur: NEGATIVE mg/dL
pH: 5 (ref 5.0–8.0)

## 2012-09-08 LAB — BASIC METABOLIC PANEL
BUN: 15 mg/dL (ref 6–23)
CO2: 24 mEq/L (ref 19–32)
Calcium: 9.2 mg/dL (ref 8.4–10.5)
Creatinine, Ser: 1.05 mg/dL (ref 0.50–1.35)
Glucose, Bld: 97 mg/dL (ref 70–99)

## 2012-09-08 LAB — URINE MICROSCOPIC-ADD ON

## 2012-09-08 MED ORDER — KETOROLAC TROMETHAMINE 30 MG/ML IJ SOLN
30.0000 mg | Freq: Once | INTRAMUSCULAR | Status: AC
  Administered 2012-09-08: 30 mg via INTRAVENOUS
  Filled 2012-09-08: qty 1

## 2012-09-08 MED ORDER — HYDROMORPHONE HCL PF 1 MG/ML IJ SOLN
1.0000 mg | Freq: Once | INTRAMUSCULAR | Status: AC
  Administered 2012-09-08: 1 mg via INTRAVENOUS
  Filled 2012-09-08: qty 1

## 2012-09-08 MED ORDER — HYDROMORPHONE HCL PF 1 MG/ML IJ SOLN: 1.0000 mg | Freq: Once | INTRAMUSCULAR | Status: DC

## 2012-09-08 MED ORDER — SODIUM CHLORIDE 0.9 % IV BOLUS (SEPSIS)
1000.0000 mL | Freq: Once | INTRAVENOUS | Status: AC
  Administered 2012-09-08: 1000 mL via INTRAVENOUS

## 2012-09-08 MED ORDER — OXYCODONE-ACETAMINOPHEN 5-325 MG PO TABS: 2.0000 | ORAL_TABLET | ORAL | Status: DC | PRN

## 2012-09-08 MED ORDER — ONDANSETRON HCL 4 MG/2ML IJ SOLN
4.0000 mg | Freq: Once | INTRAMUSCULAR | Status: AC
  Administered 2012-09-08: 4 mg via INTRAVENOUS
  Filled 2012-09-08: qty 2

## 2012-09-08 MED ORDER — CYCLOBENZAPRINE HCL 10 MG PO TABS: 10.0000 mg | ORAL_TABLET | Freq: Two times a day (BID) | ORAL | Status: DC | PRN

## 2012-09-08 NOTE — ED Provider Notes (Signed)
History     CSN: [id number]  Arrival date & time 09/08/12  1742   First MD Initiated Contact with Patient 09/08/12 1755      Chief Complaint  Patient presents with  . Flank Pain    (Consider location/radiation/quality/duration/timing/severity/associated sxs/prior treatment) HPI Comments: Patient is a 42 year old male who presents with right flank pain for the past 2.5 hours. Patient was seen in the ED yesterday and diagnosed with a kidney stone. The pain is located in his right flank and does not radiate. The pain is described as sharp and severe. The pain started suddenly and progressively worsened since the onset. No alleviating/aggravating factors. The patient has tried Percocet and flomax for symptoms without relief. Associated symptoms include nothing. Patient reports complete relief of his pain from yesterday for about 8 hours before the pain started today. Patient denies fever, headache, NVD, chest pain, SOB, dysuria, constipation.  Patient is a 42 y.o. male presenting with flank pain.  Flank Pain    Past Medical History  Diagnosis Date  . Rapid heart rate   . Anxiety   . Asthma     Past Surgical History  Procedure Laterality Date  . Vasectomy  2005    Family History  Problem Relation Age of Onset  . Colon polyps Mother 56  . Colon cancer Maternal Grandfather 83  . Memory loss Father   . Dementia Paternal Grandfather     History  Substance Use Topics  . Smoking status: Never Smoker   . Smokeless tobacco: Never Used  . Alcohol Use: Yes     Comment: occasional      Review of Systems  Genitourinary: Positive for flank pain.  All other systems reviewed and are negative.    Allergies  Penicillins  Home Medications   Current Outpatient Rx  Name  Route  Sig  Dispense  Refill  . albuterol (PROVENTIL HFA;VENTOLIN HFA) 108 (90 BASE) MCG/ACT inhaler   Inhalation   Inhale 2 puffs into the lungs every 6 (six) hours as needed for wheezing or shortness of  breath (before exercise).         . metoprolol succinate (TOPROL-XL) 25 MG 24 hr tablet   Oral   Take 25 mg by mouth every morning.         . mometasone (ASMANEX 60 METERED DOSES) 220 MCG/INH inhaler   Inhalation   Inhale 1 puff into the lungs daily.   1 Inhaler   11   . ondansetron (ZOFRAN) 4 MG tablet   Oral   Take 1 tablet (4 mg total) by mouth every 6 (six) hours.   12 tablet   0   . oxyCODONE-acetaminophen (PERCOCET/ROXICET) 5-325 MG per tablet   Oral   Take 2 tablets by mouth every 4 (four) hours as needed for pain.   15 tablet   0   . PARoxetine (PAXIL-CR) 25 MG 24 hr tablet   Oral   Take 25 mg by mouth every morning.         . tamsulosin (FLOMAX) 0.4 MG CAPS   Oral   Take 1 capsule (0.4 mg total) by mouth daily after supper.   30 capsule   0     BP 147/92  Pulse 75  Temp(Src) 99.2 F (37.3 C) (Oral)  Resp 17  SpO2 100%  Physical Exam  Nursing note and vitals reviewed. Constitutional: He is oriented to person, place, and time. He appears well-developed and well-nourished. No distress.  HENT:  Head:  Normocephalic and atraumatic.  Eyes: Conjunctivae are normal.  Neck: Normal range of motion. Neck supple.  Cardiovascular: Normal rate and regular rhythm.  Exam reveals no gallop and no friction rub.   No murmur heard. Pulmonary/Chest: Effort normal and breath sounds normal. He has no wheezes. He has no rales. He exhibits no tenderness.  Abdominal: Soft. He exhibits no distension. There is no tenderness. There is no rebound and no guarding.  Genitourinary:  Right CVA tenderness to palpation.   Musculoskeletal: Normal range of motion.  Neurological: He is alert and oriented to person, place, and time. Coordination normal.  Speech is goal-oriented. Moves limbs without ataxia.   Skin: Skin is warm and dry.  Psychiatric: He has a normal mood and affect. His behavior is normal.    ED Course  Procedures (including critical care time)  Labs Reviewed   CBC WITH DIFFERENTIAL - Abnormal; Notable for the following:    WBC 12.1 (*)    Neutro Abs 8.7 (*)    All other components within normal limits  BASIC METABOLIC PANEL - Abnormal; Notable for the following:    GFR calc non Af Amer 86 (*)    All other components within normal limits  URINALYSIS, ROUTINE W REFLEX MICROSCOPIC - Abnormal; Notable for the following:    Hgb urine dipstick TRACE (*)    All other components within normal limits  URINE MICROSCOPIC-ADD ON - Abnormal; Notable for the following:    Bacteria, UA FEW (*)    All other components within normal limits   Ct Abdomen Pelvis Wo Contrast  09/07/2012  *RADIOLOGY REPORT*  Clinical Data: Right flank pain starting about 30 minutes ago. Nausea.  CT ABDOMEN AND PELVIS WITHOUT CONTRAST  Technique:  Multidetector CT imaging of the abdomen and pelvis was performed following the standard protocol without intravenous contrast.  Comparison: None.  Findings: Focal scarring in the anterior left lung base.  There is mild right-sided pyelocaliectasis and ureterectasis down to the level of the distal ureter where there is a tiny stone at the ureterovesicle junction measuring about 2 mm diameter.  There is mild periureteral stranding.  No pyelocaliectasis or ureterectasis on the left side.  No left renal or ureteral stones.  The unenhanced appearance of the liver, spleen, gallbladder, pancreas, adrenal glands, inferior vena cava, abdominal aorta, and retroperitoneal lymph nodes is unremarkable.  Prominent visceral adipose tissues.  Stomach, small bowel, and colon are not abnormally distended.  No free air or free fluid in the abdomen.  Pelvis:  Diverticula in the sigmoid colon without diverticulitis. The prostate gland is not enlarged.  The bladder is decompressed. The appendix is normal.  No free or loculated pelvic fluid collections.  No significant pelvic lymphadenopathy.  Mild degenerative changes in the lumbar spine.  IMPRESSION: 2 mm diameter stone  in the distal right ureter with mild proximal obstruction.   Original Report Authenticated By: Burman Nieves, M.D.    Dg Abd 1 View  09/08/2012  *RADIOLOGY REPORT*  Clinical Data: Right flank pain  ABDOMEN - 1 VIEW  Comparison: CT 09/07/2012  Findings: Normal bowel gas pattern.  There is moderate stool in the right and transverse colon.  Small calcification in the distal right ureter on the CT is not seen.  This could be too small or nonradiopaque to identify.  No other renal calculi.  IMPRESSION: Constipation.  Distal right ureteral calculus no longer seen.  However, this was very small and could be difficult to see on x-ray.   Original  Report Authenticated By: Janeece Riggers, M.D.      1. History of kidney stones       MDM  6:27 PM Patient will have KUB, urinalysis and labs. Patient will have dilaudid and zofran. Patient afebrile with stable vitals at this time.   7:59 PM Patient likely having uretal spasm from right side stone from yesterday. I will control patient's pain here and send him home with pain medication. Patient is afebrile. Urinalysis unremarkable. Patient signed out to Elpidio Anis, PA-C.       Emilia Beck, PA-C 09/10/12 2119

## 2012-09-08 NOTE — ED Notes (Signed)
Patient was just here Friday morning for kidney stones. Was pain free at noon. Then at 1600 it began to hurt again. Was told to come back to ED.

## 2012-09-08 NOTE — ED Notes (Signed)
Pt c/o R flank pain. States he has a hx of kidney stones. Pt states he was on Friday in ED for same. States the pain went away and then came back this afternoon around 1600 and has remained constant. Pt state he took some Percocet  X 2 at 1700 and is not helping yet. Pain is sharp and constant. Pt ambulatory to exam room with steady gait. Pt states he has a ride home.

## 2012-09-12 NOTE — ED Provider Notes (Signed)
Medical screening examination/treatment/procedure(s) were performed by non-physician practitioner and as supervising physician I was immediately available for consultation/collaboration.   Benny Lennert, MD 09/12/12 [phone]

## 2012-09-28 ENCOUNTER — Ambulatory Visit (INDEPENDENT_AMBULATORY_CARE_PROVIDER_SITE_OTHER): Payer: 59 | Admitting: Family Medicine

## 2012-09-28 ENCOUNTER — Encounter: Payer: Self-pay | Admitting: Family Medicine

## 2012-09-28 VITALS — BP 118/84 | HR 82 | Temp 99.1°F | Resp 16 | Ht 71.75 in | Wt 243.2 lb

## 2012-09-28 DIAGNOSIS — I10 Essential (primary) hypertension: Secondary | ICD-10-CM

## 2012-09-28 DIAGNOSIS — E669 Obesity, unspecified: Secondary | ICD-10-CM

## 2012-09-28 DIAGNOSIS — E786 Lipoprotein deficiency: Secondary | ICD-10-CM

## 2012-09-28 LAB — LIPID PANEL
Cholesterol: 155 mg/dL (ref 0–200)
HDL: 24 mg/dL — ABNORMAL LOW (ref >39)
Total CHOL/HDL Ratio: 6.5 Ratio
Triglycerides: 192 mg/dL — ABNORMAL HIGH (ref <150)

## 2012-09-28 MED ORDER — MOMETASONE FUROATE 220 MCG/INH IN AEPB: 1.0000 | INHALATION_SPRAY | Freq: Every day | RESPIRATORY_TRACT | Status: DC

## 2012-09-28 MED ORDER — PAROXETINE HCL ER 25 MG PO TB24: 25.0000 mg | ORAL_TABLET | ORAL | Status: DC

## 2012-09-28 MED ORDER — METOPROLOL SUCCINATE ER 25 MG PO TB24: 25.0000 mg | ORAL_TABLET | Freq: Every morning | ORAL | Status: DC

## 2012-09-28 NOTE — Progress Notes (Signed)
S:  This pt is a 42 y.o. Cauc male who has HTN and dyslipidemia (high TGs and low HDL). He is making good progress w/ weight loss program; he is doing strength training and cardio w/ a Systems analyst. Unfortunately, he passed a kidney stone in March (1st occurrence) and has been evaluated by Urology.  Pt is compliant w/ increased hydration and medications w/o adverse effects. He denies fatigue, diaphoresis, CP or tightness, palpitations, SOB or cough, edema, HA or dizziness, weakness or syncope.   Patient Active Problem List  Diagnosis  . Special screening for malignant neoplasms, colon  . FH: colon polyps  . Obesity (BMI 30-39.9)  . Hypertriglyceridemia  . HTN (hypertension)  . Anxiety disorder    ROS: As per HPI.  O:  Filed Vitals:   09/28/12 0813  BP: 118/84  Pulse: 82  Temp: 99.1 F (37.3 C)  Resp: 16   GEN: In NAD; WN,WD.   Weight down 15 lbs in 12 months. HENT: /AT; EOMI w/ clear conj/ sclerae. Otherwise normal. COR: RRR. LUNGS: CTA; normal resp rate and effort. SKIN: W&D; no rashes or pallor. NEURO: A&O x 3; CNs intact. Nonfocal.  A/P: Low HDL (under 40) - Triglycerides have been very elevated in the past; pt currently taking Niacin.  Plan: Lipid panel  Obesity, unspecified- continue with lifestyle changes, nutrition modifications and scheduled activities for cardiovascular fitness.  HTN (hypertension)- stable; no medication change.  Meds ordered this encounter  Medications  . metoprolol succinate (TOPROL-XL) 25 MG 24 hr tablet    Sig: Take 1 tablet (25 mg total) by mouth every morning.    Dispense:  30 tablet    Refill:  11  . mometasone (ASMANEX 60 METERED DOSES) 220 MCG/INH inhaler    Sig: Inhale 1 puff into the lungs daily.    Dispense:  1 Inhaler    Refill:  11  . PARoxetine (PAXIL-CR) 25 MG 24 hr tablet    Sig: Take 1 tablet (25 mg total) by mouth every morning.    Dispense:  30 tablet    Refill:  11

## 2012-09-28 NOTE — Patient Instructions (Signed)
Vitamin D3 2000 IU taken once daily will help keep your Vitamin D level in the normal range and help your metabolism overall; it can make your metabolism more efficient so that your weight loss program is more productive. Take this oil-containing capsule with your Fish Oil capsule.  Other medications- it is a good idea to take Metoprolol separate from Paroxetine because taking the 2 medications together can cause slower heart rate and blood pressure drop.

## 2012-11-12 ENCOUNTER — Other Ambulatory Visit: Payer: Self-pay | Admitting: Physician Assistant

## 2012-12-07 ENCOUNTER — Other Ambulatory Visit: Payer: Self-pay | Admitting: Family Medicine

## 2012-12-10 ENCOUNTER — Ambulatory Visit (INDEPENDENT_AMBULATORY_CARE_PROVIDER_SITE_OTHER): Payer: 59 | Admitting: Physician Assistant

## 2012-12-10 VITALS — BP 120/78 | HR 74 | Temp 98.1°F | Resp 19 | Ht 72.0 in | Wt 243.0 lb

## 2012-12-10 DIAGNOSIS — J45909 Unspecified asthma, uncomplicated: Secondary | ICD-10-CM

## 2012-12-10 DIAGNOSIS — J029 Acute pharyngitis, unspecified: Secondary | ICD-10-CM

## 2012-12-10 DIAGNOSIS — F411 Generalized anxiety disorder: Secondary | ICD-10-CM

## 2012-12-10 MED ORDER — MOMETASONE FUROATE 220 MCG/INH IN AEPB: 1.0000 | INHALATION_SPRAY | Freq: Every day | RESPIRATORY_TRACT | Status: DC

## 2012-12-10 MED ORDER — LORAZEPAM 1 MG PO TABS: 1.0000 mg | ORAL_TABLET | Freq: Two times a day (BID) | ORAL | Status: AC | PRN

## 2012-12-10 MED ORDER — FLUTICASONE PROPIONATE 50 MCG/ACT NA SUSP: 2.0000 | Freq: Every day | NASAL | Status: DC

## 2012-12-10 NOTE — Progress Notes (Signed)
   [address], [place] [address]   Phone [phone]  Subjective:    Patient ID: Jacob Cabrera, male    DOB: [birth year], 42 y.o.   MRN: [id number]  HPI Pt presents to clinic with multiple concerns 1- His lorazepam that he uses for flying is expired - he noticed last year when he had to use it 1 pill was not enough and he is leaving for [place] on Sat for a diving trip.   2- needs refills for his Asmanex - he had a CPE in the fall but is out of refills 3- he is also having a sore throat - he thinks it might be allergies but he is leaving on Sat for vacation and 2 of his kids had strep throat - he has some cough with sputum production but does not really feel sick - throat is getting worse.  OTC - mucinex yesterday  Review of Systems  Constitutional: Negative for fever and chills.  HENT: Positive for congestion and sore throat. Negative for rhinorrhea and postnasal drip.   Respiratory: Positive for cough (? color of sputum - worse in the am). Negative for shortness of breath and wheezing.   Gastrointestinal: Negative for nausea, vomiting and diarrhea.  Musculoskeletal: Negative for myalgias.  Neurological: Positive for headaches.       Objective:   Physical Exam  Vitals reviewed. Constitutional: He is oriented to person, place, and time. He appears well-developed and well-nourished.  HENT:  Head: Normocephalic and atraumatic.  Right Ear: Hearing, tympanic membrane, external ear and ear canal normal.  Left Ear: Hearing, tympanic membrane, external ear and ear canal normal.  Nose: Mucosal edema (pale and very swollen on the L>R) present.  Mouth/Throat: Uvula is midline, oropharynx is clear and moist and mucous membranes are normal.  Eyes: Conjunctivae are normal.  Neck: Normal range of motion.  Cardiovascular: Normal rate, regular rhythm and normal heart sounds.   No murmur heard. Pulmonary/Chest: Effort normal and breath sounds normal.  Lymphadenopathy:    He has no cervical  adenopathy.  Neurological: He is alert and oriented to person, place, and time.  Skin: Skin is warm and dry.  Psychiatric: He has a normal mood and affect. His behavior is normal. Judgment and thought content normal.     Results for orders placed in visit on 12/10/12  POCT RAPID STREP A (OFFICE)      Result Value Range   Rapid Strep A Screen Negative  Negative       Assessment & Plan:  Acute pharyngitis - I think that the patient is experiencing allergies and we will try to be aggressive in his treatment due to his weekend plans.  Plan: POCT rapid strep A, fluticasone (FLONASE) 50 MCG/ACT nasal spray  Unspecified asthma(493.90) - pt is currently not having problems - so we will continue his current regimen- Plan: mometasone (ASMANEX 60 METERED DOSES) 220 MCG/INH inhaler  Anxiety state, unspecified - Plan: LORazepam (ATIVAN) 1 MG tablet - can take 1-2 bid for flying  Benny Lennert PA-C 12/10/2012 11:38 AM

## 2013-01-02 ENCOUNTER — Other Ambulatory Visit: Payer: Self-pay | Admitting: Dermatology

## 2013-04-03 ENCOUNTER — Ambulatory Visit (INDEPENDENT_AMBULATORY_CARE_PROVIDER_SITE_OTHER): Payer: 59 | Admitting: Family Medicine

## 2013-04-03 ENCOUNTER — Encounter: Payer: Self-pay | Admitting: Family Medicine

## 2013-04-03 VITALS — BP 117/80 | HR 71 | Temp 98.3°F | Resp 16 | Ht 72.0 in | Wt 253.0 lb

## 2013-04-03 DIAGNOSIS — Z23 Encounter for immunization: Secondary | ICD-10-CM

## 2013-04-03 DIAGNOSIS — E669 Obesity, unspecified: Secondary | ICD-10-CM

## 2013-04-03 DIAGNOSIS — Z0000 Encounter for general adult medical examination without abnormal findings: Secondary | ICD-10-CM

## 2013-04-03 DIAGNOSIS — E781 Pure hyperglyceridemia: Secondary | ICD-10-CM

## 2013-04-03 DIAGNOSIS — I10 Essential (primary) hypertension: Secondary | ICD-10-CM

## 2013-04-03 LAB — COMPREHENSIVE METABOLIC PANEL
AST: 17 U/L (ref 0–37)
Alkaline Phosphatase: 50 U/L (ref 39–117)
BUN: 16 mg/dL (ref 6–23)
Calcium: 9.2 mg/dL (ref 8.4–10.5)
Chloride: 105 mEq/L (ref 96–112)
Creat: 0.81 mg/dL (ref 0.50–1.35)
Glucose, Bld: 96 mg/dL (ref 70–99)
Potassium: 4 mEq/L (ref 3.5–5.3)

## 2013-04-03 LAB — POCT URINALYSIS DIPSTICK
Bilirubin, UA: NEGATIVE
Glucose, UA: NEGATIVE
Ketones, UA: NEGATIVE
Leukocytes, UA: NEGATIVE
Nitrite, UA: NEGATIVE
pH, UA: 5.5

## 2013-04-03 LAB — LIPID PANEL
HDL: 34 mg/dL — ABNORMAL LOW (ref >39)
LDL Cholesterol: 93 mg/dL (ref 0–99)
Total CHOL/HDL Ratio: 4.8 Ratio

## 2013-04-03 LAB — TSH: TSH: 2.245 u[IU]/mL (ref 0.350–4.500)

## 2013-04-03 MED ORDER — BLOOD PRESSURE MONITORING DEVI: 1.0000 | Freq: Every day | Status: AC

## 2013-04-03 NOTE — Progress Notes (Signed)
Subjective:    Patient ID: Jacob Cabrera, male    DOB: 02/28/1971, 42 y.o.   MRN: [id number]  HPI  This 42 y.o. Cauc male has HTN and is obese. He works out w/ a Psychologist, educational 3x/ week. Earlier this year, he was treated for kidney stone. Has been advised to increase water intake but no specific diet changes. He has chronic anxiety (since childhood) and links his eating disorder to that; he has temporary success w/ diet modification but then resumes old eating habits and regains weight. He is open to seeing a nutritionist for advise.  Pt works at an [employer].  Patient Active Problem List   Diagnosis Date Noted  . Obesity (BMI 30-39.9) 11/18/2011  . Hypertriglyceridemia 11/18/2011  . HTN (hypertension) 11/18/2011  . Anxiety disorder 11/18/2011  . Special screening for malignant neoplasms, colon 05/30/2011  . FH: colon polyps 05/30/2011   PMHx, Soc Hx and Fam Hx reviewed.  Medications reconciled.   Review of Systems  Constitutional: Negative.   HENT: Positive for tinnitus. Negative for congestion, ear pain, hearing loss, rhinorrhea and sinus pressure.   Eyes: Negative.        Professional vision eval annually @ [facility]. Wears corrective lenses.  Respiratory: Negative.   Cardiovascular: Negative.   Gastrointestinal: Negative.   Endocrine: Negative.   Genitourinary: Negative.   Musculoskeletal: Negative.   Skin: Negative.   Allergic/Immunologic: Negative.   Neurological: Positive for dizziness. Negative for syncope, speech difficulty, weakness, light-headedness, numbness and headaches.  Hematological: Negative.   Psychiatric/Behavioral: Negative for behavioral problems, sleep disturbance, dysphoric mood and decreased concentration. The patient is nervous/anxious. The patient is not hyperactive.        Objective:   Physical Exam  Nursing note and vitals reviewed. Constitutional: He is oriented to person, place, and time. Vital signs are normal. He appears well-developed  and well-nourished. No distress.  HENT:  Head: Normocephalic and atraumatic.  Right Ear: Hearing, tympanic membrane, external ear and ear canal normal.  Left Ear: Hearing, tympanic membrane, external ear and ear canal normal.  Nose: Nose normal. No nasal deformity or septal deviation.  Mouth/Throat: Uvula is midline, oropharynx is clear and moist and mucous membranes are normal. No oral lesions. Normal dentition. No dental caries.  Eyes: Conjunctivae, EOM and lids are normal. No scleral icterus.  Neck: Normal range of motion and full passive range of motion without pain. Neck supple. No spinous process tenderness and no muscular tenderness present. No mass and no thyromegaly present.  Cardiovascular: Normal rate, regular rhythm, S1 normal, S2 normal and normal pulses.   No extrasystoles are present. Exam reveals distant heart sounds. Exam reveals no gallop and no friction rub.   No murmur heard. Pulmonary/Chest: Effort normal and breath sounds normal. No respiratory distress.  Abdominal: Soft. Normal appearance and bowel sounds are normal. He exhibits no distension and no mass. There is no hepatosplenomegaly. There is no tenderness. There is no guarding and no CVA tenderness. No hernia.  Genitourinary:  Deferred.  Musculoskeletal: Normal range of motion. He exhibits no edema and no tenderness.  Lymphadenopathy:       Head (right side): No submental, no submandibular, no posterior auricular and no occipital adenopathy present.       Head (left side): No submental, no submandibular, no posterior auricular and no occipital adenopathy present.    He has no cervical adenopathy.       Right: No inguinal and no supraclavicular adenopathy present.  Left: No inguinal and no supraclavicular adenopathy present.  Neurological: He is alert and oriented to person, place, and time. He has normal strength and normal reflexes. He displays no atrophy. No cranial nerve deficit or sensory deficit. He exhibits  normal muscle tone. He displays a negative Romberg sign. Coordination and gait normal.  Skin: Skin is warm, dry and intact. No ecchymosis, no lesion and no rash noted. He is not diaphoretic. No cyanosis or erythema. No pallor. Nails show no clubbing.  Psychiatric: He has a normal mood and affect. His speech is normal and behavior is normal. Judgment and thought content normal. Cognition and memory are normal.     Results for orders placed in visit on 04/03/13  POCT URINALYSIS DIPSTICK      Result Value Range   Color, UA yellow     Clarity, UA clear     Glucose, UA neg     Bilirubin, UA neg     Ketones, UA neg     Spec Grav, UA >=1.030     Blood, UA small     pH, UA 5.5     Protein, UA neg     Urobilinogen, UA 0.2     Nitrite, UA neg     Leukocytes, UA Negative         Assessment & Plan:  Routine general medical examination at a health care facility - Plan: POCT urinalysis dipstick, Comprehensive metabolic panel, Lipid panel, TSH, T3, free  HTN (hypertension) - Stable and controlled; continue current medication. RX: Blood pressure cuff.  Plan: Comprehensive metabolic panel, Lipid panel, TSH, T3, free  Hypertriglyceridemia - Advised Mediterranean Diet; Nutrition referral.  Plan: Comprehensive metabolic panel, Lipid panel, TSH, T3, free  Obesity, unspecified - Continue exercise; focus on healthier lifestyle choices. Plan: Amb ref to Medical Nutrition Therapy-MNT  Need for prophylactic vaccination and inoculation against influenza - Plan: Flu Vaccine QUAD 42+ mos IM, Comprehensive metabolic panel, Lipid panel, TSH, T3, free

## 2013-04-03 NOTE — Patient Instructions (Addendum)
Keeping you healthy  Get these tests  Blood pressure- Have your blood pressure checked once a year by your healthcare provider.  Normal blood pressure is 120/80.  Weight- Have your body mass index (BMI) calculated to screen for obesity.  BMI is a measure of body fat based on height and weight. You can also calculate your own BMI at https://www.west-esparza.com/.  Cholesterol- Have your cholesterol checked regularly starting at age 42, sooner may be necessary if you have diabetes, high blood pressure, if a family member developed heart diseases at an early age or if you smoke.   Chlamydia, HIV, and other sexual transmitted disease- Get screened each year until the age of 84 then within three months of each new sexual partner.  Diabetes- Have your blood sugar checked regularly if you have high blood pressure, high cholesterol, a family history of diabetes or if you are overweight.  Get these vaccines  Flu shot- Every fall. You received your Flu vaccine today.  Tetanus shot- Every 10 years. Tdap was administered in 2010; your next Tetanus is due 2020.   Take these steps  Don't smoke- If you do smoke, ask your healthcare provider about quitting. For tips on how to quit, go to www.smokefree.gov or call 1-800-QUIT-NOW.  Be physically active- Exercise 5 days a week for at least 30 minutes.  If you are not already physically active start slow and gradually work up to 30 minutes of moderate physical activity.  Examples of moderate activity include walking briskly, mowing the yard, dancing, swimming bicycling, etc.  Eat a healthy diet- Eat a variety of healthy foods such as fruits, vegetables, low fat milk, low fat cheese, yogurt, lean meats, poultry, fish, beans, tofu, etc.  For more information on healthy eating, go to www.thenutritionsource.org  Drink alcohol in moderation- Limit alcohol intake two drinks or less a day.  Never drink and drive.  Dentist- Brush and floss teeth twice daily; visit  your dentis twice a year.  Depression-Your emotional health is as important as your physical health.  If you're feeling down, losing interest in things you normally enjoy please talk with your healthcare provider.  Gun Safety- If you keep a gun in your home, keep it unloaded and with the safety lock on.  Bullets should be stored separately.  Helmet use- Always wear a helmet when riding a motorcycle, bicycle, rollerblading or skateboarding.  Safe sex- If you may be exposed to a sexually transmitted infection, use a condom  Seat belts- Seat bels can save your life; always wear one.  Smoke/Carbon Monoxide detectors- These detectors need to be installed on the appropriate level of your home.  Replace batteries at least once a year.  Skin Cancer- When out in the sun, cover up and use sunscreen SPF 15 or higher.  Violence- If anyone is threatening or hurting you, please tell your healthcare provider.    Re: Nutrition management and "food addiction"- I am referring you to a Dietitian in the [facility] to help you with meal planning and coping with overeating issues.  Research THE MEDITERRANEAN DIET in order to find out the raise your good cholesterol (HDL), lower your triglycerides and lose weight.

## 2013-04-04 ENCOUNTER — Encounter: Payer: Self-pay | Admitting: Family Medicine

## 2013-08-08 ENCOUNTER — Other Ambulatory Visit: Payer: Self-pay | Admitting: Family Medicine

## 2013-08-08 NOTE — Telephone Encounter (Signed)
Asmanex MDI refilled for 4 months.

## 2013-08-08 NOTE — Telephone Encounter (Signed)
Pt had annual w/you in Oct, but don't know that asthma was discussed. Can we RF?

## 2013-10-02 ENCOUNTER — Ambulatory Visit (INDEPENDENT_AMBULATORY_CARE_PROVIDER_SITE_OTHER): Payer: 59 | Admitting: Family Medicine

## 2013-10-02 ENCOUNTER — Encounter: Payer: Self-pay | Admitting: Family Medicine

## 2013-10-02 VITALS — BP 128/80 | HR 78 | Temp 98.8°F | Resp 18 | Ht 71.5 in | Wt 262.0 lb

## 2013-10-02 DIAGNOSIS — H9319 Tinnitus, unspecified ear: Secondary | ICD-10-CM

## 2013-10-02 DIAGNOSIS — I10 Essential (primary) hypertension: Secondary | ICD-10-CM

## 2013-10-02 DIAGNOSIS — E781 Pure hyperglyceridemia: Secondary | ICD-10-CM

## 2013-10-02 DIAGNOSIS — Z0110 Encounter for examination of ears and hearing without abnormal findings: Secondary | ICD-10-CM

## 2013-10-02 DIAGNOSIS — Z822 Family history of deafness and hearing loss: Secondary | ICD-10-CM

## 2013-10-02 LAB — BASIC METABOLIC PANEL
BUN: 19 mg/dL (ref 6–23)
CALCIUM: 9.2 mg/dL (ref 8.4–10.5)
CO2: 24 mEq/L (ref 19–32)
Chloride: 103 mEq/L (ref 96–112)
Creat: 0.78 mg/dL (ref 0.50–1.35)
GLUCOSE: 88 mg/dL (ref 70–99)
Potassium: 3.9 mEq/L (ref 3.5–5.3)
SODIUM: 140 meq/L (ref 135–145)

## 2013-10-02 LAB — LIPID PANEL
CHOL/HDL RATIO: 5.4 ratio
Cholesterol: 174 mg/dL (ref 0–200)
HDL: 32 mg/dL — AB (ref >39)
LDL Cholesterol: 81 mg/dL (ref 0–99)
Triglycerides: 307 mg/dL — ABNORMAL HIGH (ref <150)
VLDL: 61 mg/dL — ABNORMAL HIGH (ref 0–40)

## 2013-10-02 NOTE — Progress Notes (Signed)
S:  This 43 y.o. Cauc male has HTN, stable on current medication. He has not been successful w/ weight reduction; his family recently travelled to [place] for vacation. He did not observe nutritional restrictions while on vacation. Pt aware of need to practice TLCs for BP control, cardiovascular health and weight/ lipid reduction.  He has concerns about impaired hearing. His wife remarked that he seems to be saying "Huh?" a lot.; no one else has remarked on this. Pt has not increased volume of TV. Minor hx of noise pollution. Has mild tinnitus, most noticeable when surroundings are quiet. Significant family hx of deafness or hearing impairment. No vision disturbances, n/v, HA, dizziness, numbness, weakness or syncope.  Patient Active Problem List   Diagnosis Date Noted  . Family history of deafness or hearing loss 10/02/2013  . Obesity (BMI 30-39.9) 11/18/2011  . Hypertriglyceridemia 11/18/2011  . HTN (hypertension) 11/18/2011  . Anxiety disorder 11/18/2011  . Special screening for malignant neoplasms, colon 05/30/2011  . FH: colon polyps 05/30/2011   Prior to Admission medications   Medication Sig Start Date End Date Taking? Authorizing Provider  albuterol (PROVENTIL HFA;VENTOLIN HFA) 108 (90 BASE) MCG/ACT inhaler Inhale 2 puffs into the lungs every 6 (six) hours as needed for wheezing or shortness of breath (before exercise).   Yes Historical Provider, MD  ASMANEX 60 METERED DOSES 220 MCG/INH inhaler USE 1 PUFF INTO THE LUNGS EVERY DAY 08/08/13  Yes Barton Fanny, MD  Blood Pressure Monitoring DEVI 1 Device by Does not apply route daily. 04/03/13  Yes Barton Fanny, MD  LORazepam (ATIVAN) 1 MG tablet Take 1 tablet (1 mg total) by mouth 2 (two) times daily as needed for anxiety. 12/10/12  Yes Mancel Bale, PA-C  metoprolol succinate (TOPROL-XL) 25 MG 24 hr tablet Take 1 tablet (25 mg total) by mouth every morning. 09/28/12  Yes Barton Fanny, MD  PARoxetine (PAXIL-CR) 25 MG  24 hr tablet TAKE 1 TABLET BY MOUTH EVERY MORNING 11/12/12  Yes Barton Fanny, MD  cholecalciferol (VITAMIN D) 1000 UNITS tablet Take 1,000 Units by mouth daily.    Historical Provider, MD  fish oil-omega-3 fatty acids 1000 MG capsule Take 2 g by mouth daily.    Historical Provider, MD  fluticasone (FLONASE) 50 MCG/ACT nasal spray Place 2 sprays into the nose daily. 12/10/12   Mancel Bale, PA-C   PMHx, Surg Hx, Soc and Fam Hx reviewed.  ROS; As per HPI.  O: Filed Vitals:   10/02/13 0755  BP: 128/80  Pulse: 78  Temp: 98.8 F (37.1 C)  Resp: 18   GEN: In NAD; WN,WD. HENT: [place]/AT; EOMI w/ clear conj/sclerae. EACs/canals normal. TMs normal w/o significant effusion, erythema or scarring. Post ph narrow w/o erythema or exudate. NECK: Supple w/o LAN or TMG. COR: RRR. Normal S1 and S2. No m/g/r. LUNGS: CTA; normal resp rate and effort. SKIN: W&D; intact w/o erythema, rashes, jaundice or pallor. NEURO: A&O x 3; CNs intact. DTRs normal. Nonfocal.  A/P: HTN (hypertension) - Stable; continue current treatment. Encouraged weight reductioin. Plan: Basic metabolic panel, Vitamin D, 25-hydroxy  Hypertriglyceridemia - Improve nutrition.    Plan: Lipid panel  Evaluation of hearing impairment - Suspect hereditary component. Plan: Ambulatory referral to Audiology  Tinnitus - No red flags in history. Suspect high frequency hearing loss.Plan: Ambulatory referral to Audiology  Family history of deafness or hearing loss

## 2013-10-02 NOTE — Patient Instructions (Signed)
Tinnitus Sounds you hear in your ears and coming from within the ear is called tinnitus. This can be a symptom of many ear disorders. It is often associated with hearing loss.  Tinnitus can be seen with:  Infections.  Ear blockages such as wax buildup.  Meniere's disease.  Ear damage.  Inherited.  Occupational causes. While irritating, it is not usually a threat to health. When the cause of the tinnitus is wax, infection in the middle ear, or foreign body it is easily treated. Hearing loss will usually be reversible.  TREATMENT  When treating the underlying cause does not get rid of tinnitus, it may be necessary to get rid of the unwanted sound by covering it up with more pleasant background noises. This may include music, the radio etc. There are tinnitus maskers which can be worn which produce background noise to cover up the tinnitus. Avoid all medications which tend to make tinnitus worse such as alcohol, caffeine, aspirin, and nicotine. There are many soothing background tapes such as rain, ocean, thunderstorms, etc. These soothing sounds help with sleeping or resting. Keep all follow-up appointments and referrals. This is important to identify the cause of the problem. It also helps avoid complications, impaired hearing, disability, or chronic pain. Document Released: 06/06/2005 Document Revised: 08/29/2011 Document Reviewed: 01/23/2008 [facility] Patient Information 2014 [place].   I have placed an order for audiology evaluation. Once I have the report of that evaluation, you will be notified if you need further evaluation with an Ear, Nose and Throat (ENT) Specialist.  Try to get back on track as far as healthy lifestyle practices (nutrition and exercise for weight reduction).  Exercise to Stay Healthy Exercise helps you become and stay healthy. EXERCISE IDEAS AND TIPS Choose exercises that:  You enjoy.  Fit into your day. You do not need to exercise really hard to  be healthy. You can do exercises at a slow or medium level and stay healthy. You can:  Stretch before and after working out.  Try yoga, Pilates, or tai chi.  Lift weights.  Walk fast, swim, jog, run, climb stairs, bicycle, dance, or rollerskate.  Take aerobic classes. Exercises that burn about 150 calories:  Running 1  miles in 15 minutes.  Playing volleyball for 45 to 60 minutes.  Washing and waxing a car for 45 to 60 minutes.  Playing touch football for 45 minutes.  Walking 1  miles in 35 minutes.  Pushing a stroller 1  miles in 30 minutes.  Playing basketball for 30 minutes.  Raking leaves for 30 minutes.  Bicycling 5 miles in 30 minutes.  Walking 2 miles in 30 minutes.  Dancing for 30 minutes.  Shoveling snow for 15 minutes.  Swimming laps for 20 minutes.  Walking up stairs for 15 minutes.  Bicycling 4 miles in 15 minutes.  Gardening for 30 to 45 minutes.  Jumping rope for 15 minutes.  Washing windows or floors for 45 to 60 minutes. Document Released: 07/09/2010 Document Revised: 08/29/2011 Document Reviewed: 07/09/2010 [facility] Patient Information 2014 [place], [place].

## 2013-10-03 LAB — VITAMIN D 25 HYDROXY (VIT D DEFICIENCY, FRACTURES): VIT D 25 HYDROXY: 28 ng/mL — AB (ref 30–89)

## 2013-10-05 ENCOUNTER — Encounter: Payer: Self-pay | Admitting: Family Medicine

## 2013-10-31 ENCOUNTER — Other Ambulatory Visit: Payer: Self-pay | Admitting: Family Medicine

## 2013-11-01 NOTE — Telephone Encounter (Signed)
Dr Leward Quan, you just saw pt in April for some chronic issues but don't see depression discussed. Is it OK to give RFs?

## 2013-11-02 NOTE — Telephone Encounter (Signed)
Paroxetine refills authorized for 6 months.

## 2013-11-29 ENCOUNTER — Other Ambulatory Visit: Payer: Self-pay | Admitting: Family Medicine

## 2013-12-02 NOTE — Telephone Encounter (Signed)
Dr Leward Quan, you saw pt in Apr but don't see this med discussed. Can we RF until he is due for f/up in Oct?

## 2013-12-03 NOTE — Telephone Encounter (Signed)
Asmanex MDI refilled for 6 months.

## 2014-03-17 ENCOUNTER — Encounter: Payer: Self-pay | Admitting: Gastroenterology

## 2014-04-03 ENCOUNTER — Encounter: Payer: 59 | Admitting: Family Medicine

## 2014-04-05 ENCOUNTER — Other Ambulatory Visit: Payer: Self-pay | Admitting: Family Medicine

## 2014-04-16 ENCOUNTER — Encounter: Payer: Self-pay | Admitting: Family Medicine

## 2014-04-16 ENCOUNTER — Ambulatory Visit (INDEPENDENT_AMBULATORY_CARE_PROVIDER_SITE_OTHER): Payer: BC Managed Care – PPO | Admitting: Family Medicine

## 2014-04-16 VITALS — BP 133/92 | HR 70 | Temp 98.1°F | Resp 16 | Ht 72.0 in | Wt 264.6 lb

## 2014-04-16 DIAGNOSIS — I10 Essential (primary) hypertension: Secondary | ICD-10-CM

## 2014-04-16 DIAGNOSIS — Z0000 Encounter for general adult medical examination without abnormal findings: Secondary | ICD-10-CM

## 2014-04-16 DIAGNOSIS — E781 Pure hyperglyceridemia: Secondary | ICD-10-CM

## 2014-04-16 DIAGNOSIS — E786 Lipoprotein deficiency: Secondary | ICD-10-CM

## 2014-04-16 DIAGNOSIS — Z131 Encounter for screening for diabetes mellitus: Secondary | ICD-10-CM

## 2014-04-16 LAB — LIPID PANEL
Cholesterol: 152 mg/dL (ref 0–200)
HDL: 29 mg/dL — ABNORMAL LOW (ref >39)
LDL CALC: 88 mg/dL (ref 0–99)
Total CHOL/HDL Ratio: 5.2 Ratio
Triglycerides: 177 mg/dL — ABNORMAL HIGH (ref <150)
VLDL: 35 mg/dL (ref 0–40)

## 2014-04-16 LAB — POCT URINALYSIS DIPSTICK
Bilirubin, UA: NEGATIVE
Glucose, UA: NEGATIVE
Ketones, UA: NEGATIVE
LEUKOCYTES UA: NEGATIVE
Nitrite, UA: NEGATIVE
PH UA: 5
PROTEIN UA: NEGATIVE
Spec Grav, UA: 1.025
UROBILINOGEN UA: 0.2

## 2014-04-16 LAB — BASIC METABOLIC PANEL
BUN: 12 mg/dL (ref 6–23)
CALCIUM: 9.2 mg/dL (ref 8.4–10.5)
CO2: 24 mEq/L (ref 19–32)
CREATININE: 0.77 mg/dL (ref 0.50–1.35)
Chloride: 106 mEq/L (ref 96–112)
Glucose, Bld: 96 mg/dL (ref 70–99)
Potassium: 4.1 mEq/L (ref 3.5–5.3)
Sodium: 140 mEq/L (ref 135–145)

## 2014-04-16 LAB — POCT GLYCOSYLATED HEMOGLOBIN (HGB A1C): HEMOGLOBIN A1C: 5

## 2014-04-16 MED ORDER — METOPROLOL SUCCINATE ER 25 MG PO TB24: ORAL_TABLET | ORAL | Status: AC

## 2014-04-16 MED ORDER — ALBUTEROL SULFATE HFA 108 (90 BASE) MCG/ACT IN AERS: 2.0000 | INHALATION_SPRAY | Freq: Four times a day (QID) | RESPIRATORY_TRACT | Status: DC | PRN

## 2014-04-16 NOTE — Patient Instructions (Signed)
I have ordered a referral for NUTRITION COUNSELING at [facility]. You will be contacted about the appointment details. As we discussed, you can set up an appointment to meet with your trainer and resume a exercise program; try to walk 15-30 minutes 3 times per week.  Resume Fish Oil supplement and Vitamin D 2000 units daily.  Below is some information about MEDITERRANEAN DIET which is a good guide for healthy nutrition.      Mediterranean Diet  Why follow it? Research shows.   Those who follow the Mediterranean diet have a reduced risk of heart disease    The diet is associated with a reduced incidence of Parkinson's and Alzheimer's diseases   People following the diet may have longer life expectancies and lower rates of chronic diseases    The Dietary Guidelines for Americans recommends the Mediterranean diet as an eating plan to promote health and prevent disease  What Is the Mediterranean Diet?    Healthy eating plan based on typical foods and recipes of Mediterranean-style cooking   The diet is primarily a plant based diet; these foods should make up a majority of meals   Starches - Plant based foods should make up a majority of meals - They are an important sources of vitamins, minerals, energy, antioxidants, and fiber - Choose whole grains, foods high in fiber and minimally processed items  - Typical grain sources include wheat, oats, barley, corn, brown rice, bulgar, farro, millet, polenta, couscous  - Various types of beans include chickpeas, lentils, fava beans, black beans, white beans   Fruits  Veggies - Large quantities of antioxidant rich fruits & veggies; 6 or more servings  - Vegetables can be eaten raw or lightly drizzled with oil and cooked  - Vegetables common to the traditional Mediterranean Diet include: artichokes, arugula, beets, broccoli, brussel sprouts, cabbage, carrots, celery, collard greens, cucumbers, eggplant, kale, leeks, lemons,  lettuce, mushrooms, okra, onions, peas, peppers, potatoes, pumpkin, radishes, rutabaga, shallots, spinach, sweet potatoes, turnips, zucchini - Fruits common to the Mediterranean Diet include: apples, apricots, avocados, cherries, clementines, dates, figs, grapefruits, grapes, melons, nectarines, oranges, peaches, pears, pomegranates, strawberries, tangerines  Fats - Replace butter and margarine with healthy oils, such as olive oil, canola oil, and tahini  - Limit nuts to no more than a handful a day  - Nuts include walnuts, almonds, pecans, pistachios, pine nuts  - Limit or avoid candied, honey roasted or heavily salted nuts - Olives are central to the [employer] - can be eaten whole or used in a variety of dishes   Meats Protein - Limiting red meat: no more than a few times a month - When eating red meat: choose lean cuts and keep the portion to the size of deck of cards - Eggs: approx. 0 to 4 times a week  - Fish and lean poultry: at least 2 a week  - Healthy protein sources include, chicken, [place], lean beef, lamb - Increase intake of seafood such as tuna, salmon, trout, mackerel, shrimp, scallops - Avoid or limit high fat processed meats such as sausage and bacon  Dairy - Include moderate amounts of low fat dairy products  - Focus on healthy dairy such as fat free yogurt, skim milk, low or reduced fat cheese - Limit dairy products higher in fat such as whole or 2% milk, cheese, ice cream  Alcohol - Moderate amounts of red wine is ok  - No more than 5 oz daily for women (  all ages) and men older than age 73  - No more than 10 oz of wine daily for men younger than 7  Other - Limit sweets and other desserts  - Use herbs and spices instead of salt to flavor foods  - Herbs and spices common to the traditional Mediterranean Diet include: basil, bay leaves, chives, cloves, cumin, fennel, garlic, lavender, marjoram, mint, oregano, parsley, pepper, rosemary, sage, savory, sumac, tarragon,  thyme   It's not just a diet, it's a lifestyle:    The Mediterranean diet includes lifestyle factors typical of those in the region    Foods, drinks and meals are best eaten with others and savored   Daily physical activity is important for overall good health   This could be strenuous exercise like running and aerobics   This could also be more leisurely activities such as walking, housework, yard-work, or taking the stairs   Moderation is the key; a balanced and healthy diet accommodates most foods and drinks   Consider portion sizes and frequency of consumption of certain foods   Meal Ideas & Options:    Breakfast:  o Whole wheat toast or whole wheat English muffins with peanut butter & hard boiled egg o Steel cut oats topped with apples & cinnamon and skim milk  o Fresh fruit: banana, strawberries, melon, berries, peaches  o Smoothies: strawberries, bananas, greek yogurt, peanut butter o Low fat greek yogurt with blueberries and granola  o Egg white omelet with spinach and mushrooms o Breakfast couscous: whole wheat couscous, apricots, skim milk, cranberries    Sandwiches:  o Hummus and grilled vegetables (peppers, zucchini, squash) on whole wheat bread   o Grilled chicken on whole wheat pita with lettuce, tomatoes, cucumbers or tzatziki  o Tuna salad on whole wheat bread: tuna salad made with greek yogurt, olives, red peppers, capers, green onions o Garlic rosemary lamb pita: lamb sauted with garlic, rosemary, salt & pepper; add lettuce, cucumber, greek yogurt to pita - flavor with lemon juice and black pepper    Seafood:  o Mediterranean grilled salmon, seasoned with garlic, basil, parsley, lemon juice and black pepper o Shrimp, lemon, and spinach whole-grain pasta salad made with low fat greek yogurt  o Seared scallops with lemon orzo  o Seared tuna steaks seasoned salt, pepper, coriander topped with tomato mixture of olives, tomatoes, olive oil, minced garlic, parsley, green  onions and cappers    Meats:  o Herbed greek chicken salad with kalamata olives, cucumber, feta  o Red bell peppers stuffed with spinach, bulgur, lean ground beef (or lentils) & topped with feta   o Kebabs: skewers of chicken, tomatoes, onions, zucchini, squash  o [place] burgers: made with red onions, mint, dill, lemon juice, feta cheese topped with roasted red peppers   Vegetarian o Cucumber salad: cucumbers, artichoke hearts, celery, red onion, feta cheese, tossed in olive oil & lemon juice  o Hummus and whole grain pita points with a greek salad (lettuce, tomato, feta, olives, cucumbers, red onion) o Lentil soup with celery, carrots made with vegetable broth, garlic, salt and pepper  o Tabouli salad: parsley, bulgur, mint, scallions, cucumbers, tomato, radishes, lemon juice, olive oil, salt and pepper. o

## 2014-04-16 NOTE — Progress Notes (Signed)
Subjective:    Patient ID: Jacob Cabrera, male    DOB: 1970-06-27, 43 y.o.   MRN: [id number]  HPI  This 43 y.o. Cauc male is here for wellness exam. He indicates serious intentions to improve lifestyle and fitness. He has mild EIA and requests refill for Albuterol MDI.  Patient Active Problem List   Diagnosis Date Noted  . Abnormally low high density lipoprotein (HDL) cholesterol with hypertriglyceridemia 04/16/2014  . Family history of deafness or hearing loss 10/02/2013  . Obesity (BMI 30-39.9) 11/18/2011  . HTN (hypertension) 11/18/2011  . Anxiety disorder 11/18/2011  . Special screening for malignant neoplasms, colon 05/30/2011  . FH: colon polyps 05/30/2011    Prior to Admission medications   Medication Sig Start Date End Date Taking? Authorizing Provider  albuterol (PROVENTIL HFA;VENTOLIN HFA) 108 (90 BASE) MCG/ACT inhaler Inhale 2 puffs into the lungs every 6 (six) hours as needed for wheezing or shortness of breath (before exercise).   Yes Barton Fanny, MD  [facility] 60 METERED DOSES 220 MCG/INH inhaler USE ONE PUFF INTO THE LUNGS EVERY DAY   Yes Barton Fanny, MD  Blood Pressure Monitoring DEVI 1 Device by Does not apply route daily. 04/03/13  Yes Barton Fanny, MD  LORazepam (ATIVAN) 1 MG tablet Take 1 tablet (1 mg total) by mouth 2 (two) times daily as needed for anxiety. 12/10/12  Yes Mancel Bale, PA-C  metoprolol succinate (TOPROL-XL) 25 MG 24 hr tablet TAKE 1 TABLET BY MOUTH IN THE MORNING 04/03/13  Yes Barton Fanny, MD  PARoxetine (PAXIL-CR) 25 MG 24 hr tablet TAKE 1 TABLET BY MOUTH EVERY MORNING   Yes Barton Fanny, MD  cholecalciferol (VITAMIN D) 1000 UNITS tablet Take 1,000 Units by mouth daily.    Historical Provider, MD  fish oil-omega-3 fatty acids 1000 MG capsule Take 2 g by mouth daily.    Historical Provider, MD  fluticasone (FLONASE) 50 MCG/ACT nasal spray Place 2 sprays into the nose daily. 12/10/12   Mancel Bale, PA-C    History    Social History  . Marital Status: Married    Spouse Name: N/A    Number of Children: N/A  . Years of Education: college   Occupational History  . Professor    Social History Main Topics  . Smoking status: Never Smoker   . Smokeless tobacco: Never Used  . Alcohol Use: Yes     Comment: occasional  . Drug Use: No  . Sexual Activity: Yes    Partners: Female   Other Topics Concern  . Not on file   Social History Narrative   Married. C   Education: [employer]. Exercise three times a wk for 45 minutes.    Family History  Problem Relation Age of Onset  . Colon polyps Mother 39  . Colon cancer Maternal Grandfather 15  . Memory loss Father   . Dementia Father   . Dementia Paternal Grandfather   . Depression Maternal Grandmother   . Mental illness Paternal Grandmother     Review of Systems  Constitutional: Negative.   HENT: Negative.   Eyes: Negative.   Respiratory: Negative.   Cardiovascular: Negative.   Gastrointestinal: Negative.   Endocrine: Negative.   Genitourinary: Negative.   Musculoskeletal: Positive for back pain, myalgias and neck stiffness.       Chiropractor works w/ pt once a month to relieve muscle spasms and back pain.  Skin: Negative.   Allergic/Immunologic: Negative.   Neurological: Negative.  Hematological: Negative.   Psychiatric/Behavioral: Negative.       Objective:   Physical Exam  Vitals reviewed. Constitutional: He is oriented to person, place, and time. Vital signs are normal. He appears well-developed and well-nourished. No distress.  HENT:  Head: Normocephalic and atraumatic.  Right Ear: Hearing, tympanic membrane, external ear and ear canal normal.  Left Ear: Hearing, tympanic membrane, external ear and ear canal normal.  Nose: Nose normal. No mucosal edema, nasal deformity or septal deviation.  Mouth/Throat: Uvula is midline, oropharynx is clear and moist and mucous membranes are normal. No oral lesions. Normal dentition. No uvula  swelling or dental caries.  Eyes: Conjunctivae, EOM and lids are normal. No scleral icterus.  Annual exam w/ [facility]; wears corrective lenses.  Neck: Trachea normal, normal range of motion, full passive range of motion without pain and phonation normal. Neck supple. No JVD present. No spinous process tenderness and no muscular tenderness present. No mass and no thyromegaly present.  Cardiovascular: Normal rate, regular rhythm, S1 normal, S2 normal, normal heart sounds, intact distal pulses and normal pulses.   No extrasystoles are present. PMI is not displaced.  Exam reveals no gallop and no friction rub.   No murmur heard. Pulmonary/Chest: Effort normal and breath sounds normal. No respiratory distress. He has no decreased breath sounds. He has no wheezes.  Abdominal: Soft. Bowel sounds are normal. He exhibits no distension, no abdominal bruit, no pulsatile midline mass and no mass. There is no hepatosplenomegaly. There is no tenderness. There is no guarding and no CVA tenderness.  Abdominal obesity obscures intra-abd masses or organomegaly.  Genitourinary:  Deferred.  Musculoskeletal:       Cervical back: Normal.       Thoracic back: Normal.       Lumbar back: Normal.  Remainder of exam unremarkable.  Lymphadenopathy:       Head (right side): No submental, no submandibular, no tonsillar, no preauricular, no posterior auricular and no occipital adenopathy present.       Head (left side): No submental, no submandibular, no tonsillar, no preauricular, no posterior auricular and no occipital adenopathy present.    He has no cervical adenopathy.       Right: No inguinal and no supraclavicular adenopathy present.       Left: No inguinal and no supraclavicular adenopathy present.  Neurological: He is alert and oriented to person, place, and time. He has normal strength. He displays no atrophy. No cranial nerve deficit or sensory deficit. He exhibits normal muscle tone. He displays a negative  Romberg sign. Coordination and gait normal.  Reflex Scores:      Tricep reflexes are 1+ on the right side and 1+ on the left side.      Bicep reflexes are 2+ on the right side and 2+ on the left side.      Brachioradialis reflexes are 1+ on the right side and 1+ on the left side.      Patellar reflexes are 2+ on the right side and 2+ on the left side. Skin: Skin is warm, dry and intact. No ecchymosis and no rash noted. He is not diaphoretic. No cyanosis or erythema. No pallor. Nails show no clubbing.  Multiple pigmented lesions on trunk; pt has annual skin surveillance exam w/ [facility].  Psychiatric: He has a normal mood and affect. His speech is normal and behavior is normal. Judgment and thought content normal. Cognition and memory are normal.    A1c= 5.0%  Assessment & Plan:  Encounter for routine history and physical exam for male - Plan: POCT urinalysis dipstick  Abnormally low high density lipoprotein (HDL) cholesterol with hypertriglyceridemia - Nutrition consultation, increase physical activity and reduce weight. Plan: Lipid panel  Essential hypertension - Stable on current medication. Encouraged weight reduction and Mediterranean Diet. Plan: Basic metabolic panel  Screening for diabetes mellitus - Plan: POCT glycosylated hemoglobin (Hb A1C), Ambulatory referral to Nutrition and Diabetic Education  Meds ordered this encounter  Medications  . albuterol (PROVENTIL HFA;VENTOLIN HFA) 108 (90 BASE) MCG/ACT inhaler    Sig: Inhale 2 puffs into the lungs every 6 (six) hours as needed for wheezing or shortness of breath (before exercise).    Dispense:  1 Inhaler    Refill:  2  . metoprolol succinate (TOPROL-XL) 25 MG 24 hr tablet    Sig: TAKE 1 TABLET BY MOUTH IN THE MORNING    Dispense:  30 tablet    Refill:  11

## 2014-05-01 ENCOUNTER — Other Ambulatory Visit: Payer: Self-pay | Admitting: Family Medicine

## 2014-05-12 ENCOUNTER — Encounter: Payer: Self-pay | Admitting: Skilled Nursing Facility1

## 2014-05-12 ENCOUNTER — Encounter: Payer: BC Managed Care – PPO | Attending: Family Medicine | Admitting: Skilled Nursing Facility1

## 2014-05-12 VITALS — Ht 72.0 in | Wt 269.0 lb

## 2014-05-12 DIAGNOSIS — Z6836 Body mass index (BMI) 36.0-36.9, adult: Secondary | ICD-10-CM | POA: Diagnosis not present

## 2014-05-12 DIAGNOSIS — I10 Essential (primary) hypertension: Secondary | ICD-10-CM | POA: Insufficient documentation

## 2014-05-12 DIAGNOSIS — E669 Obesity, unspecified: Secondary | ICD-10-CM | POA: Diagnosis present

## 2014-05-12 DIAGNOSIS — Z713 Dietary counseling and surveillance: Secondary | ICD-10-CM | POA: Diagnosis not present

## 2014-05-12 NOTE — Patient Instructions (Addendum)
-  Try to get physical activity in every day-aim for thirty minutes four times a week -Try to cut fast food meals to once maybe twice a week -Try to have the waitor box up half your food and bring the other half to you -Only weigh yourself once a week -Know your body and pay attention to how your clothes feel for success  -1-2 pounds of weight loss is healthy beyond that is muscle loss -3 meals a day and 2-3 snacks a day -Start with portions of meals -Slow and steady
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
## 2014-06-01 ENCOUNTER — Other Ambulatory Visit: Payer: Self-pay | Admitting: Physician Assistant

## 2014-06-02 NOTE — Telephone Encounter (Signed)
Paroxetine refilled for 6 months.

## 2014-06-02 NOTE — Telephone Encounter (Signed)
Dr Leward Quan, you saw pt for CPE on 04/16/14, but I don't see anxiety discussed. OK to RF?

## 2014-06-23 ENCOUNTER — Ambulatory Visit: Payer: 59 | Admitting: Skilled Nursing Facility1

## 2014-09-05 IMAGING — CT CT ABD-PELV W/O CM
1 series · 14 of 22 positions shown, 19 images · non-contrast
Comparison: None.

CLINICAL DATA: Right flank pain starting about 30 minutes ago.
Nausea.

CT ABDOMEN AND PELVIS WITHOUT CONTRAST
TECHNIQUE: Multidetector CT imaging of the abdomen and pelvis was
performed following the standard protocol without intravenous
contrast.

[Series 6: lung · axial · 0.80mm/px · z∈[-137,-42]mm · 14 of 22 slices shown, 19 images]
[im 2/22  soft-tissue]
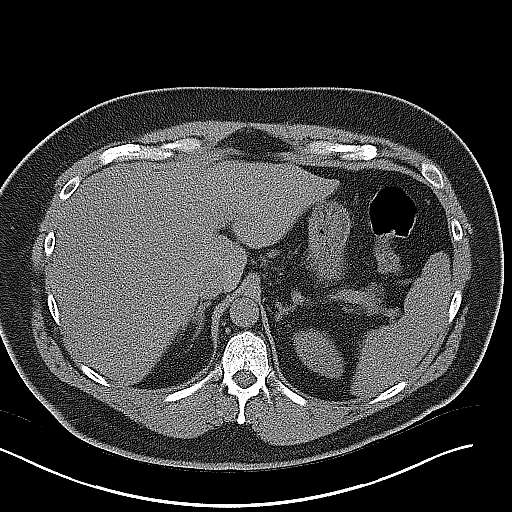
[im 2/22  bone]
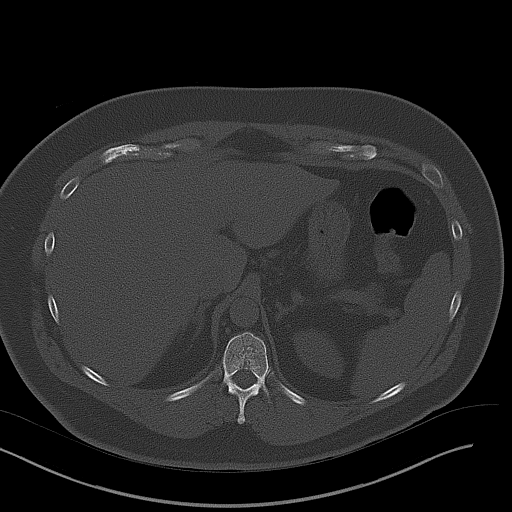
[im 4/22  soft-tissue]
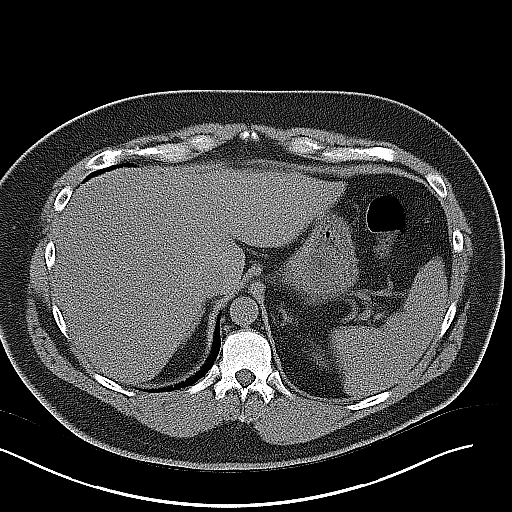
[im 5/22  soft-tissue]
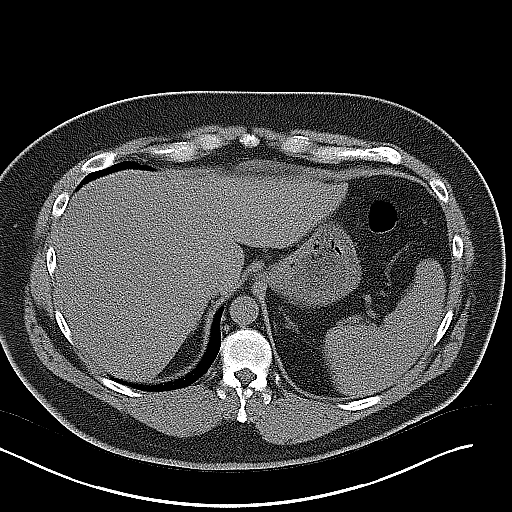
[im 7/22  soft-tissue]
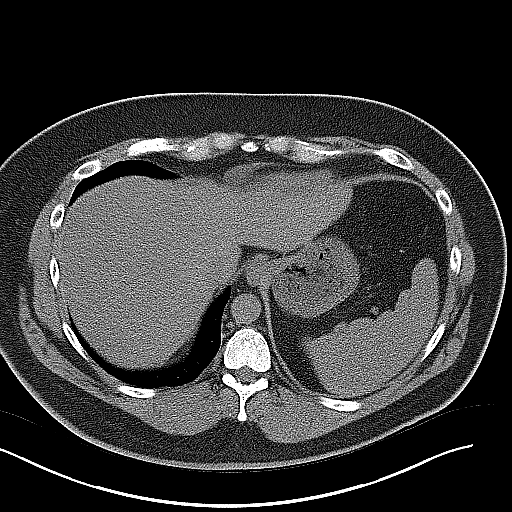
[im 8/22  soft-tissue]
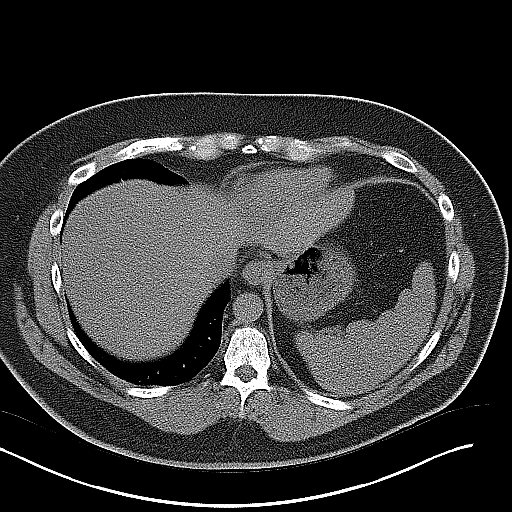
[im 10/22  soft-tissue]
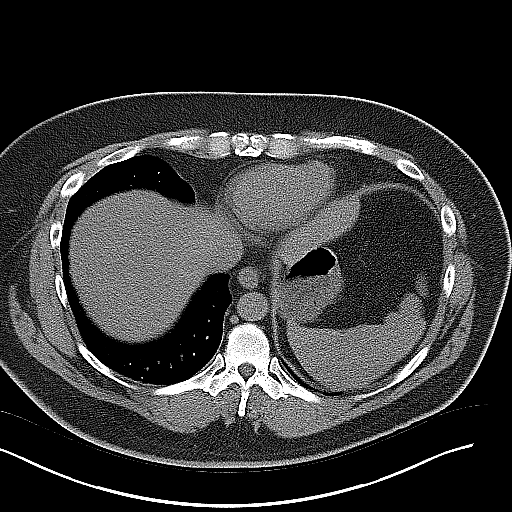
[im 12/22  soft-tissue]
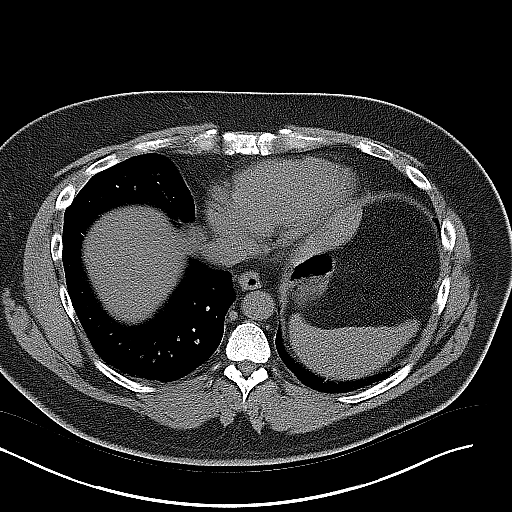
[im 13/22  soft-tissue]
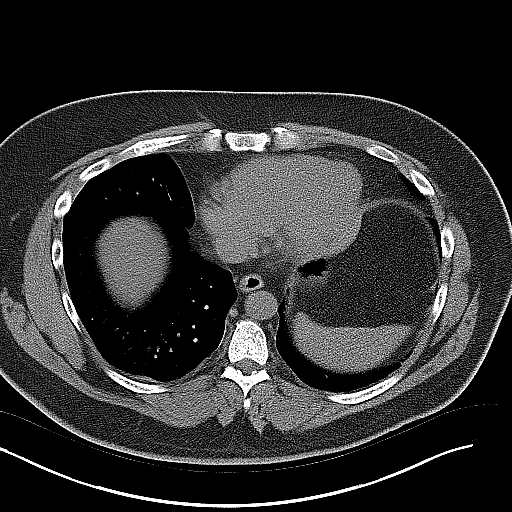
[im 15/22  soft-tissue]
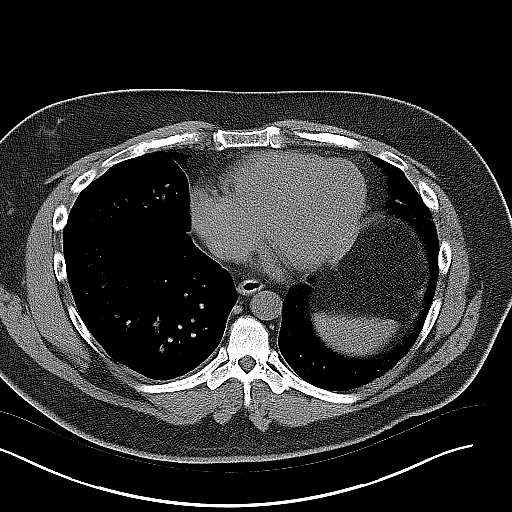
[im 15/22  bone]
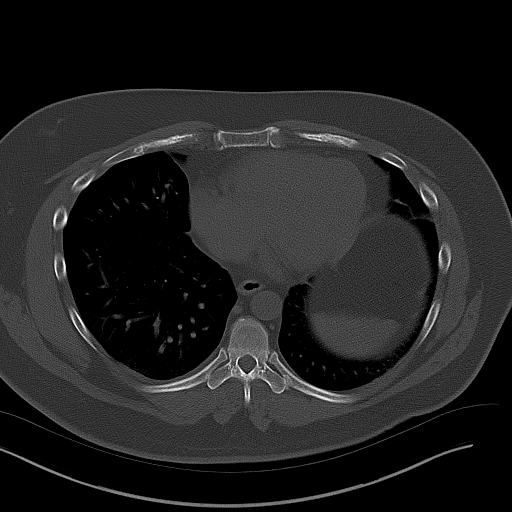
[im 16/22  soft-tissue]
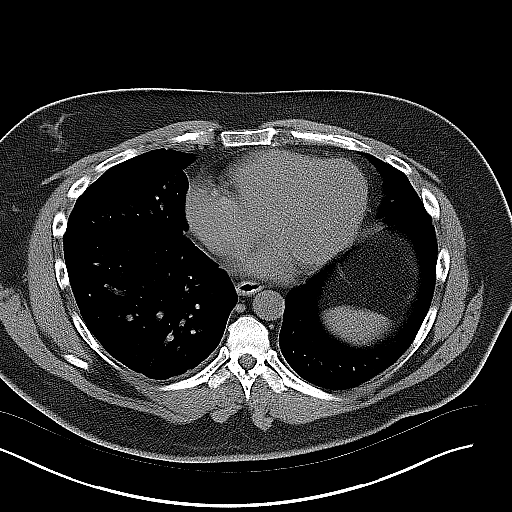
[im 18/22  soft-tissue]
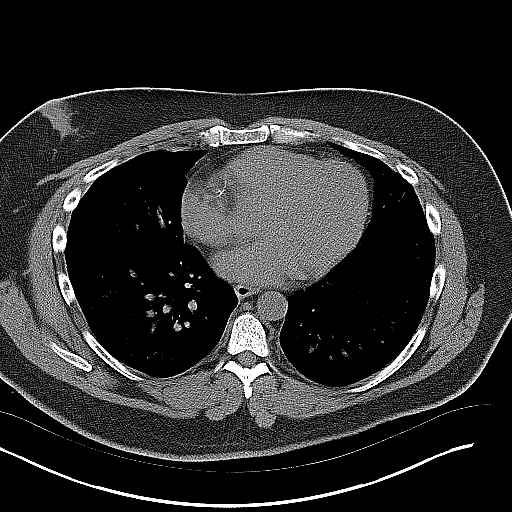
[im 18/22  lung]
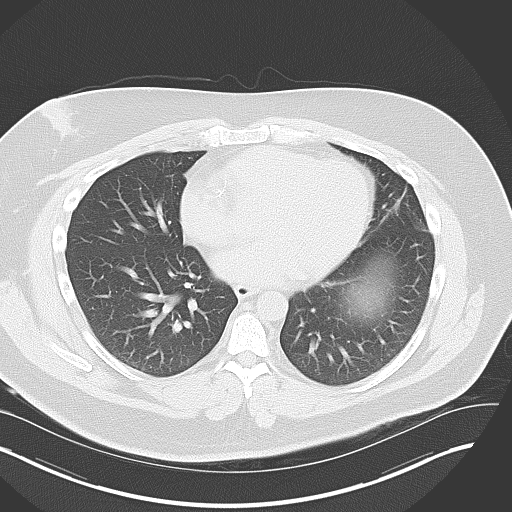
[im 19/22  soft-tissue]
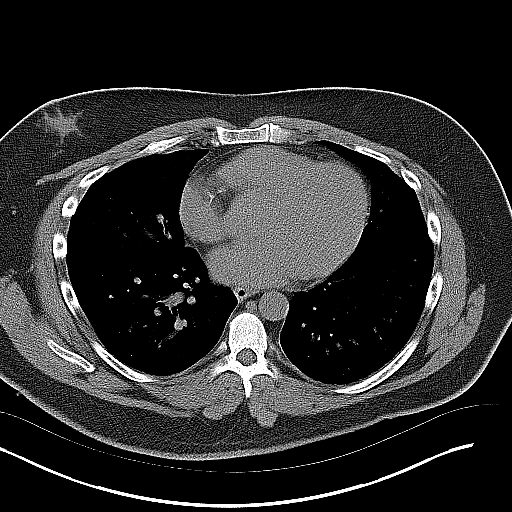
[im 19/22  lung]
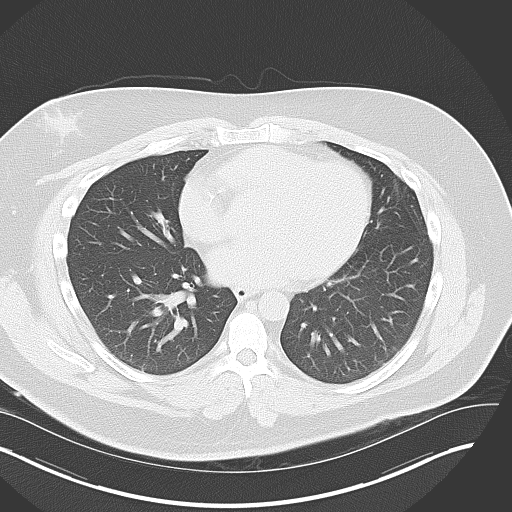
[im 20/22  lung]
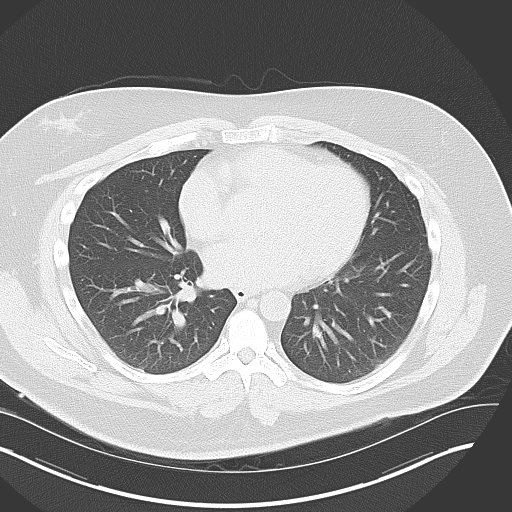
[im 21/22  soft-tissue]
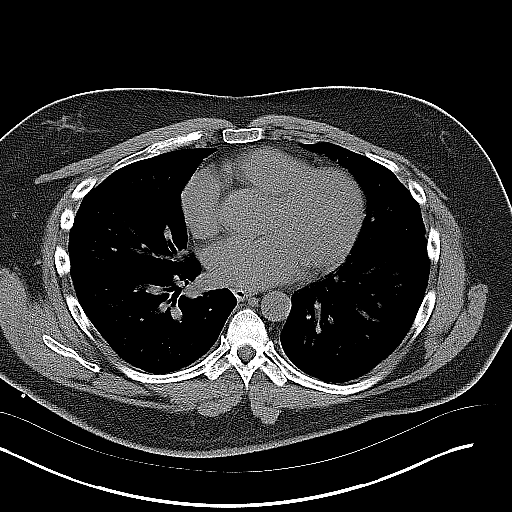
[im 21/22  lung]
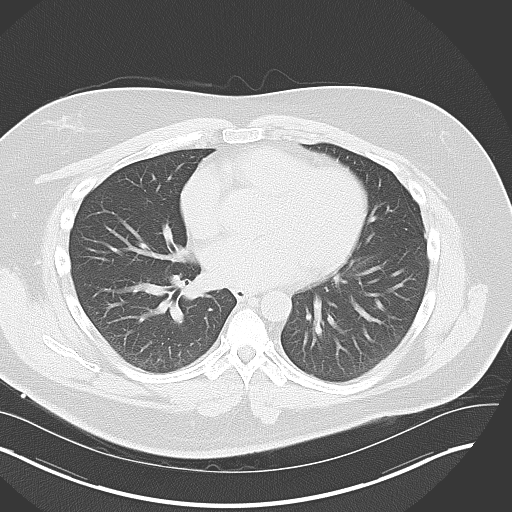

[14 of 22 positions shown; findings below may reference images not displayed]

FINDINGS: Focal scarring in the anterior left lung base.

There is mild right-sided pyelocaliectasis and ureterectasis down
to the level of the distal ureter where there is a tiny stone at
the ureterovesicle junction measuring about 2 mm diameter.  There
is mild periureteral stranding.  No pyelocaliectasis or
ureterectasis on the left side.  No left renal or ureteral stones.

The unenhanced appearance of the liver, spleen, gallbladder,
pancreas, adrenal glands, inferior vena cava, abdominal aorta, and
retroperitoneal lymph nodes is unremarkable.  Prominent visceral
adipose tissues.  Stomach, small bowel, and colon are not
abnormally distended.  No free air or free fluid in the abdomen.

Pelvis:  Diverticula in the sigmoid colon without diverticulitis.
The prostate gland is not enlarged.  The bladder is decompressed.
The appendix is normal.  No free or loculated pelvic fluid
collections.  No significant pelvic lymphadenopathy.  Mild
degenerative changes in the lumbar spine.
IMPRESSION: 2 mm diameter stone in the distal right ureter with mild proximal
obstruction.

## 2014-09-19 ENCOUNTER — Encounter (HOSPITAL_COMMUNITY): Payer: Self-pay | Admitting: Emergency Medicine

## 2014-09-19 DIAGNOSIS — Z880 Allergy status to penicillin: Secondary | ICD-10-CM | POA: Insufficient documentation

## 2014-09-19 DIAGNOSIS — N200 Calculus of kidney: Secondary | ICD-10-CM | POA: Diagnosis not present

## 2014-09-19 DIAGNOSIS — F419 Anxiety disorder, unspecified: Secondary | ICD-10-CM | POA: Insufficient documentation

## 2014-09-19 DIAGNOSIS — Z79899 Other long term (current) drug therapy: Secondary | ICD-10-CM | POA: Insufficient documentation

## 2014-09-19 DIAGNOSIS — R109 Unspecified abdominal pain: Secondary | ICD-10-CM | POA: Diagnosis present

## 2014-09-19 DIAGNOSIS — I10 Essential (primary) hypertension: Secondary | ICD-10-CM | POA: Insufficient documentation

## 2014-09-19 DIAGNOSIS — J45909 Unspecified asthma, uncomplicated: Secondary | ICD-10-CM | POA: Diagnosis not present

## 2014-09-19 NOTE — ED Notes (Signed)
Pt reports onset L flank pain this morning. Pt seen by [facility] this afternoon, had x-ray done; no kidney stone shown on x-ray. Pt told by urologist to come into ED if pain worsened. Pt states pain has progressively become worse.

## 2014-09-20 ENCOUNTER — Emergency Department (HOSPITAL_COMMUNITY)
Admission: EM | Admit: 2014-09-20 | Discharge: 2014-09-20 | Disposition: A | Discharge status: Home / Self Care | Payer: BLUE CROSS/BLUE SHIELD | Attending: Emergency Medicine | Admitting: Emergency Medicine

## 2014-09-20 ENCOUNTER — Emergency Department (HOSPITAL_COMMUNITY): Payer: BLUE CROSS/BLUE SHIELD

## 2014-09-20 DIAGNOSIS — R109 Unspecified abdominal pain: Secondary | ICD-10-CM

## 2014-09-20 DIAGNOSIS — N200 Calculus of kidney: Secondary | ICD-10-CM

## 2014-09-20 HISTORY — DX: Calculus of kidney: N20.0

## 2014-09-20 LAB — URINALYSIS, ROUTINE W REFLEX MICROSCOPIC
BILIRUBIN URINE: NEGATIVE
Glucose, UA: NEGATIVE mg/dL
Ketones, ur: NEGATIVE mg/dL
Leukocytes, UA: NEGATIVE
NITRITE: NEGATIVE
PH: 6 (ref 5.0–8.0)
Protein, ur: NEGATIVE mg/dL
SPECIFIC GRAVITY, URINE: 1.016 (ref 1.005–1.030)
UROBILINOGEN UA: 0.2 mg/dL (ref 0.0–1.0)

## 2014-09-20 LAB — URINE MICROSCOPIC-ADD ON

## 2014-09-20 MED ORDER — OXYCODONE-ACETAMINOPHEN 5-325 MG PO TABS
1.0000 | ORAL_TABLET | Freq: Once | ORAL | Status: AC
  Administered 2014-09-20: 1 via ORAL
  Filled 2014-09-20: qty 1

## 2014-09-20 MED ORDER — ONDANSETRON 4 MG PO TBDP
4.0000 mg | ORAL_TABLET | Freq: Once | ORAL | Status: AC
  Administered 2014-09-20: 4 mg via ORAL
  Filled 2014-09-20: qty 1

## 2014-09-20 MED ORDER — OXYCODONE-ACETAMINOPHEN 5-325 MG PO TABS: 2.0000 | ORAL_TABLET | ORAL | Status: DC | PRN

## 2014-09-20 MED ORDER — KETOROLAC TROMETHAMINE 60 MG/2ML IM SOLN
60.0000 mg | Freq: Once | INTRAMUSCULAR | Status: AC
  Administered 2014-09-20: 60 mg via INTRAMUSCULAR
  Filled 2014-09-20: qty 2

## 2014-09-20 MED ORDER — TAMSULOSIN HCL 0.4 MG PO CAPS: 0.4000 mg | ORAL_CAPSULE | Freq: Every day | ORAL | Status: DC

## 2014-09-20 NOTE — ED Notes (Signed)
[facility]ie at bedside at present time.

## 2014-09-20 NOTE — Discharge Instructions (Signed)
You have a 37mm kidney stone on the left side.  Follow direction below.  Follow up with your urologist for further care.  Kidney Stones Kidney stones (urolithiasis) are deposits that form inside your kidneys. The intense pain is caused by the stone moving through the urinary tract. When the stone moves, the ureter goes into spasm around the stone. The stone is usually passed in the urine.  CAUSES   A disorder that makes certain neck glands produce too much parathyroid hormone (primary hyperparathyroidism).  A buildup of uric acid crystals, similar to gout in your joints.  Narrowing (stricture) of the ureter.  A kidney obstruction present at birth (congenital obstruction).  Previous surgery on the kidney or ureters.  Numerous kidney infections. SYMPTOMS   Feeling sick to your stomach (nauseous).  Throwing up (vomiting).  Blood in the urine (hematuria).  Pain that usually spreads (radiates) to the groin.  Frequency or urgency of urination. DIAGNOSIS   Taking a history and physical exam.  Blood or urine tests.  CT scan.  Occasionally, an examination of the inside of the urinary bladder (cystoscopy) is performed. TREATMENT   Observation.  Increasing your fluid intake.  Extracorporeal shock wave lithotripsy--This is a noninvasive procedure that uses shock waves to break up kidney stones.  Surgery may be needed if you have severe pain or persistent obstruction. There are various surgical procedures. Most of the procedures are performed with the use of small instruments. Only small incisions are needed to accommodate these instruments, so recovery time is minimized. The size, location, and chemical composition are all important variables that will determine the proper choice of action for you. Talk to your health care provider to better understand your situation so that you will minimize the risk of injury to yourself and your kidney.  HOME CARE INSTRUCTIONS   Drink enough  water and fluids to keep your urine clear or pale yellow. This will help you to pass the stone or stone fragments.  Strain all urine through the provided strainer. Keep all particulate matter and stones for your health care provider to see. The stone causing the pain may be as small as a grain of salt. It is very important to use the strainer each and every time you pass your urine. The collection of your stone will allow your health care provider to analyze it and verify that a stone has actually passed. The stone analysis will often identify what you can do to reduce the incidence of recurrences.  Only take over-the-counter or prescription medicines for pain, discomfort, or fever as directed by your health care provider.  Make a follow-up appointment with your health care provider as directed.  Get follow-up X-rays if required. The absence of pain does not always mean that the stone has passed. It may have only stopped moving. If the urine remains completely obstructed, it can cause loss of kidney function or even complete destruction of the kidney. It is your responsibility to make sure X-rays and follow-ups are completed. Ultrasounds of the kidney can show blockages and the status of the kidney. Ultrasounds are not associated with any radiation and can be performed easily in a matter of minutes. SEEK MEDICAL CARE IF:  You experience pain that is progressive and unresponsive to any pain medicine you have been prescribed. SEEK IMMEDIATE MEDICAL CARE IF:   Pain cannot be controlled with the prescribed medicine.  You have a fever or shaking chills.  The severity or intensity of pain increases over 18  hours and is not relieved by pain medicine.  You develop a new onset of abdominal pain.  You feel faint or pass out.  You are unable to urinate. MAKE SURE YOU:   Understand these instructions.  Will watch your condition.  Will get help right away if you are not doing well or get  worse. Document Released: 06/06/2005 Document Revised: 02/06/2013 Document Reviewed: 11/07/2012 [facility] Patient Information 2015 [place], [place]. This information is not intended to replace advice given to you by your health care provider. Make sure you discuss any questions you have with your health care provider.  Low-Purine Diet Purines are compounds that affect the level of uric acid in your body. A low-purine diet is a diet that is low in purines. Eating a low-purine diet can prevent the level of uric acid in your body from getting too high and causing gout or kidney stones or both. WHAT DO I NEED TO KNOW ABOUT THIS DIET?  Choose low-purine foods. Examples of low-purine foods are listed in the next section.  Drink plenty of fluids, especially water. Fluids can help remove uric acid from your body. Try to drink 8-16 cups (1.9-3.8 L) a day.  Limit foods high in fat, especially saturated fat, as fat makes it harder for the body to get rid of uric acid. Foods high in saturated fat include pizza, cheese, ice cream, whole milk, fried foods, and gravies. Choose foods that are lower in fat and lean sources of protein. Use olive oil when cooking as it contains healthy fats that are not high in saturated fat.  Limit alcohol. Alcohol interferes with the elimination of uric acid from your body. If you are having a gout attack, avoid all alcohol.  Keep in mind that different people's bodies react differently to different foods. You will probably learn over time which foods do or do not affect you. If you discover that a food tends to cause your gout to flare up, avoid eating that food. You can more freely enjoy foods that do not cause problems. If you have any questions about a food item, talk to your dietitian or health care provider. WHICH FOODS ARE LOW, MODERATE, AND HIGH IN PURINES? The following is a list of foods that are low, moderate, and high in purines. You can eat any amount of the foods that  are low in purines. You may be able to have small amounts of foods that are moderate in purines. Ask your health care provider how much of a food moderate in purines you can have. Avoid foods high in purines. Grains  Foods low in purines: Enriched white bread, pasta, rice, cake, cornbread, popcorn.  Foods moderate in purines: Whole-grain breads and cereals, wheat germ, bran, oatmeal. Uncooked oatmeal. Dry wheat bran or wheat germ.  Foods high in purines: Pancakes, [place] toast, biscuits, muffins. Vegetables  Foods low in purines: All vegetables, except those that are moderate in purines.  Foods moderate in purines: Asparagus, cauliflower, spinach, mushrooms, green peas. Fruits  All fruits are low in purines. Meats and other Protein Foods  Foods low in purines: Eggs, nuts, peanut butter.  Foods moderate in purines: 80-90% lean beef, lamb, veal, pork, poultry, fish, eggs, peanut butter, nuts. Crab, lobster, oysters, and shrimp. Cooked dried beans, peas, and lentils.  Foods high in purines: Anchovies, sardines, herring, mussels, tuna, codfish, scallops, trout, and haddock. Berniece Salines. Organ meats (such as liver or kidney). Tripe. Game meat. Goose. Sweetbreads. Dairy  All dairy foods are low in  purines. Low-fat and fat-free dairy products are best because they are low in saturated fat. Beverages  Drinks low in purines: Water, carbonated beverages, tea, coffee, cocoa.  Drinks moderate in purines: Soft drinks and other drinks sweetened with high-fructose corn syrup. Juices. To find whether a food or drink is sweetened with high-fructose corn syrup, look at the ingredients list.  Drinks high in purines: Alcoholic beverages (such as beer). Condiments  Foods low in purines: Salt, herbs, olives, pickles, relishes, vinegar.  Foods moderate in purines: Butter, margarine, oils, mayonnaise. Fats and Oils  Foods low in purines: All types, except gravies and sauces made with meat.  Foods high in  purines: Gravies and sauces made with meat. Other Foods  Foods low in purines: Sugars, sweets, gelatin. Cake. Soups made without meat.  Foods moderate in purines: Meat-based or fish-based soups, broths, or bouillons. Foods and drinks sweetened with high-fructose corn syrup.  Foods high in purines: High-fat desserts (such as ice cream, cookies, cakes, pies, doughnuts, and chocolate). Contact your dietitian for more information on foods that are not listed here. Document Released: 10/01/2010 Document Revised: 06/11/2013 Document Reviewed: 05/13/2013 [facility] Patient Information 2015 [place], [place]. This information is not intended to replace advice given to you by your health care provider. Make sure you discuss any questions you have with your health care provider.

## 2014-09-20 NOTE — ED Provider Notes (Signed)
CSN: [id number]     Arrival date & time 09/19/14  2326 History   First MD Initiated Contact with Patient 09/20/14 0448     Chief Complaint  Patient presents with  . Flank Pain     (Consider location/radiation/quality/duration/timing/severity/associated sxs/prior Treatment) HPI  44 year old male with history of kidney stones who presents complaining of left flank pain. Patient reports acute onset of sharp throbbing pain to his left leg started yesterday afternoon while he was sitting. Pain was intense, nothing seems to make it better or worse, pain felt similar to prior kidney stone that he was able to passed. When pain intensified patient felt nauseous and vomited. He reported urinary frequency without urgency but no blood in urine. No associated fever, headache, chest pain, shortness of breath, testicular pain, testicle pain, or rash. He was seen at the urologist office yesterday for this complaint, had a x-ray performed shows no obvious stone. It was thought that he may be constipated and he was given magnesium citrate which he took but it did not provide any significant relief. Pain intensified this morning prompting him to come to the ER further evaluation. He denies prior history of renal stenting or lithotripsy.  Past Medical History  Diagnosis Date  . Rapid heart rate   . Anxiety   . Asthma   . Allergy   . Hypertension   . Kidney stone    Past Surgical History  Procedure Laterality Date  . Vasectomy  2005   Family History  Problem Relation Age of Onset  . Colon polyps Mother 3  . Colon cancer Maternal Grandfather 72  . Memory loss Father   . Dementia Father   . Dementia Paternal Grandfather   . Depression Maternal Grandmother   . Mental illness Paternal Grandmother    History  Substance Use Topics  . Smoking status: Never Smoker   . Smokeless tobacco: Never Used  . Alcohol Use: Yes     Comment: occasional    Review of Systems  Constitutional: Negative for fever.   Gastrointestinal: Positive for abdominal pain.  Genitourinary: Positive for flank pain.  Skin: Negative for rash and wound.  All other systems reviewed and are negative.     Allergies  Penicillins  Home Medications   Prior to Admission medications   Medication Sig Start Date End Date Taking? Authorizing Provider  ASMANEX 60 METERED DOSES 220 MCG/INH inhaler USE ONE PUFF INTO THE LUNGS EVERY DAY   Yes Barton Fanny, MD  magnesium citrate SOLN Take 1 Bottle by mouth once.   Yes Historical Provider, MD  metoprolol succinate (TOPROL-XL) 25 MG 24 hr tablet TAKE 1 TABLET BY MOUTH IN THE MORNING 04/16/14  Yes Barton Fanny, MD  PARoxetine (PAXIL-CR) 25 MG 24 hr tablet TAKE 1 TABLET BY MOUTH EVERY MORNING 06/02/14  Yes Barton Fanny, MD  traMADol (ULTRAM) 50 MG tablet Take 50 mg by mouth every 6 (six) hours as needed for moderate pain.   Yes Historical Provider, MD  albuterol (PROVENTIL HFA;VENTOLIN HFA) 108 (90 BASE) MCG/ACT inhaler Inhale 2 puffs into the lungs every 6 (six) hours as needed for wheezing or shortness of breath (before exercise). 04/16/14   Barton Fanny, MD  Blood Pressure Monitoring DEVI 1 Device by Does not apply route daily. 04/03/13   Barton Fanny, MD  cholecalciferol (VITAMIN D) 1000 UNITS tablet Take 1,000 Units by mouth daily.    Historical Provider, MD  fish oil-omega-3 fatty acids 1000 MG capsule Take 2  g by mouth daily.    Historical Provider, MD  fluticasone (FLONASE) 50 MCG/ACT nasal spray Place 2 sprays into the nose daily. Patient not taking: Reported on 09/20/2014 12/10/12   Mancel Bale, PA-C  LORazepam (ATIVAN) 1 MG tablet Take 1 tablet (1 mg total) by mouth 2 (two) times daily as needed for anxiety. Patient not taking: Reported on 09/20/2014 12/10/12   Mancel Bale, PA-C   BP 132/86 mmHg  Pulse 87  Resp 18  Ht 6' (1.829 m)  Wt 260 lb (117.935 kg)  BMI 35.25 kg/m2  SpO2 94% Physical Exam  Constitutional: He appears  well-developed and well-nourished. No distress.  Well-appearing Caucasian male who appears to be in no acute distress. Patient is alert and oriented and nontoxic in appearance.  HENT:  Head: Atraumatic.  Eyes: Conjunctivae are normal.  Neck: Normal range of motion. Neck supple.  Cardiovascular: Normal rate and regular rhythm.   Pulmonary/Chest: Effort normal and breath sounds normal.  Abdominal: Soft. Bowel sounds are normal. He exhibits no distension. There is no tenderness.  Genitourinary:  Left CVA tenderness  Neurological: He is alert.  Skin: No rash noted.  Psychiatric: He has a normal mood and affect.  Nursing note and vitals reviewed.   ED Course  Procedures (including critical care time)  Patient with history of kidney stone presents with left flank pain. Renal stone studies demonstrate a 4 mm stone at the L UVJ with minimal hydronephrosis. Patient received 1 Percocet in the ER and reported moderate improvement of symptoms. Currently awaits UA to rule out urinary tract infection which may complicate his condition.  9:08 AM UA shows no evidence of urinary tract infection. Pain is currently controlled. Patient stable for discharge.Mancel Parsons given, urologic follow-up as needed.  Labs Review Labs Reviewed  URINALYSIS, ROUTINE W REFLEX MICROSCOPIC - Abnormal; Notable for the following:    Hgb urine dipstick SMALL (*)    All other components within normal limits  URINE MICROSCOPIC-ADD ON    Imaging Review Ct Renal Stone Study  09/20/2014   CLINICAL DATA:  Left flank pain, acute onset.  Initial encounter.  EXAM: CT ABDOMEN AND PELVIS WITHOUT CONTRAST  TECHNIQUE: Multidetector CT imaging of the abdomen and pelvis was performed following the standard protocol without IV contrast.  COMPARISON:  Renal ultrasound performed 09/19/2014, and CT of the abdomen and pelvis from 09/07/2012  FINDINGS: The visualized lung bases are clear.  There is mild diffuse fatty infiltration within the  liver, with mild sparing about the gallbladder fossa. The gallbladder is within normal limits. The pancreas and adrenal glands are unremarkable.  Minimal left-sided hydronephrosis is noted, with mild prominence of the left ureter to the level of an obstructing 4 mm stone at the left side of the base of the bladder, along the left vesicoureteral junction. Mild nonspecific perinephric stranding is noted bilaterally. No nonobstructing renal stones are identified.  No free fluid is identified. The small bowel is unremarkable in appearance. The stomach is within normal limits. No acute vascular abnormalities are seen.  The appendix is normal in caliber and contains trace air, without evidence for appendicitis. Minimal diverticulosis is noted along the descending and proximal sigmoid colon, without evidence of diverticulitis.  The bladder is mildly distended and otherwise unremarkable. The prostate remains normal in size. No inguinal lymphadenopathy is seen.  No acute osseous abnormalities are identified.  IMPRESSION: 1. Minimal left-sided hydronephrosis, with an obstructing 4 mm stone noted distally at the left side of the base  of the bladder, along the left vesicoureteral junction. 2. Mild diffuse fatty infiltration within the liver. 3. Minimal diverticulosis along the descending and proximal sigmoid colon, without evidence of diverticulitis.   Electronically Signed   By: Garald Balding M.D.   On: 09/20/2014 06:17     EKG Interpretation None      MDM   Final diagnoses:  Flank pain  Kidney stone on left side    BP 159/95 mmHg  Pulse 66  Resp 18  Ht 6' (1.829 m)  Wt 260 lb (117.935 kg)  BMI 35.25 kg/m2  SpO2 98%  I have reviewed nursing notes and vital signs. I personally reviewed the imaging tests through PACS system  I reviewed available ER/hospitalization records thought the EMR     Domenic Moras, PA-C 09/20/14 [place], MD 09/20/14 2302

## 2014-11-15 ENCOUNTER — Other Ambulatory Visit: Payer: Self-pay | Admitting: Family Medicine

## 2014-12-23 ENCOUNTER — Other Ambulatory Visit: Payer: Self-pay | Admitting: Physician Assistant

## 2015-01-12 ENCOUNTER — Other Ambulatory Visit: Payer: Self-pay | Admitting: Physician Assistant

## 2015-01-17 ENCOUNTER — Telehealth: Payer: Self-pay

## 2015-01-17 NOTE — Telephone Encounter (Signed)
Paxil is needed until his appointment in September.  He has not picked up the 15 prescribed on 01/12/15.    [phone]

## 2015-01-19 MED ORDER — PAROXETINE HCL ER 25 MG PO TB24: 25.0000 mg | ORAL_TABLET | Freq: Every morning | ORAL | Status: AC

## 2015-01-19 NOTE — Telephone Encounter (Signed)
Meds ordered this encounter  Medications  . PARoxetine (PAXIL-CR) 25 MG 24 hr tablet    Sig: Take 1 tablet (25 mg total) by mouth every morning.    Dispense:  30 tablet    Refill:  1    Order Specific Question:  Supervising Provider    Answer:  DOOLITTLE, ROBERT P [[id number]]

## 2015-01-19 NOTE — Telephone Encounter (Signed)
Left message Rx was sent in

## 2015-01-19 NOTE — Telephone Encounter (Signed)
Can we refill? He states he has an appt with Dr. Maudie Mercury on 03/03/2015 and then he plans to follow up with him for the Paxil. Please advise.

## 2016-05-03 ENCOUNTER — Encounter: Payer: Self-pay | Admitting: Gastroenterology

## 2017-09-14 ENCOUNTER — Ambulatory Visit: Payer: Self-pay | Admitting: Podiatry

## 2017-09-28 ENCOUNTER — Other Ambulatory Visit: Payer: Self-pay | Admitting: Podiatry

## 2017-09-28 ENCOUNTER — Ambulatory Visit (INDEPENDENT_AMBULATORY_CARE_PROVIDER_SITE_OTHER): Payer: BLUE CROSS/BLUE SHIELD

## 2017-09-28 ENCOUNTER — Encounter: Payer: Self-pay | Admitting: Podiatry

## 2017-09-28 ENCOUNTER — Ambulatory Visit: Payer: BLUE CROSS/BLUE SHIELD | Admitting: Podiatry

## 2017-09-28 VITALS — BP 139/79 | HR 56 | Resp 16

## 2017-09-28 DIAGNOSIS — M1000 Idiopathic gout, unspecified site: Secondary | ICD-10-CM

## 2017-09-28 DIAGNOSIS — M79671 Pain in right foot: Secondary | ICD-10-CM

## 2017-09-28 DIAGNOSIS — M79672 Pain in left foot: Secondary | ICD-10-CM

## 2017-09-28 NOTE — Patient Instructions (Signed)

## 2017-09-29 NOTE — Progress Notes (Signed)
Subjective:   Patient ID: Jacob Cabrera, male   DOB: 47 y.o.   MRN: [id number]   HPI Patient presents stating he had gout diagnosis in December and has been on allopurinol and colchicine.  States the joint is still red but it is nowhere near as sore as it was previously and that has been the first attack he had   Review of Systems  All other systems reviewed and are negative.       Objective:  Physical Exam  Constitutional: He appears well-developed and well-nourished.  Cardiovascular: Intact distal pulses.  Pulmonary/Chest: Effort normal.  Musculoskeletal: Normal range of motion.  Neurological: He is alert.  Skin: Skin is warm.  Nursing note and vitals reviewed.   Neurovascular status intact muscle strength adequate patient found to have redness and inflammation around the first metatarsal head right that is localized with also structural changes of the first metatarsal noted     Assessment:  Difficult to tell between a standard bunion deformity versus a gout-like attack or a combination of the 2     Plan:  H&P and discussion about gout and structural bunion deformity discussed with patient.  At this point we are not going to treated except for watching it and we are going to slowly reduce his colchicine and continue allopurinol for the time being.  If symptoms continue to persist or other problems occur he will be seen back if he develops an acute inflammation I need to see the patient right away  X-rays indicate structural bunion deformity right over left with swelling around the first metatarsal head right

## 2019-09-13 ENCOUNTER — Ambulatory Visit: Payer: BLUE CROSS/BLUE SHIELD | Attending: Internal Medicine

## 2019-09-13 DIAGNOSIS — Z23 Encounter for immunization: Secondary | ICD-10-CM

## 2019-09-13 NOTE — Progress Notes (Signed)
   Covid-19 Vaccination Clinic  Name:  Jacob Cabrera    MRN: [id number] DOB: Oct 27, 1970  09/13/2019  [name] was observed post Covid-19 immunization for 15 minutes without incident. He was provided with Vaccine Information Sheet and instruction to access the V-Safe system.   [name] was instructed to call 911 with any severe reactions post vaccine: Marland Kitchen Difficulty breathing  . Swelling of face and throat  . A fast heartbeat  . A bad rash all over body  . Dizziness and weakness   Immunizations Administered    Name Date Dose VIS Date Route   Pfizer COVID-19 Vaccine 09/13/2019  3:26 PM 0.3 mL 05/31/2019 Intramuscular   Manufacturer: [place]   Lot: [id number]   [place]: [id number]

## 2020-07-03 ENCOUNTER — Ambulatory Visit (INDEPENDENT_AMBULATORY_CARE_PROVIDER_SITE_OTHER): Payer: PRIVATE HEALTH INSURANCE

## 2020-07-03 ENCOUNTER — Other Ambulatory Visit: Payer: Self-pay

## 2020-07-03 ENCOUNTER — Ambulatory Visit (INDEPENDENT_AMBULATORY_CARE_PROVIDER_SITE_OTHER): Payer: PRIVATE HEALTH INSURANCE | Admitting: Podiatry

## 2020-07-03 DIAGNOSIS — M7662 Achilles tendinitis, left leg: Secondary | ICD-10-CM

## 2020-07-03 DIAGNOSIS — M79672 Pain in left foot: Secondary | ICD-10-CM

## 2020-07-03 NOTE — Patient Instructions (Signed)

## 2020-07-06 ENCOUNTER — Ambulatory Visit: Payer: BLUE CROSS/BLUE SHIELD | Admitting: Podiatry

## 2020-07-06 NOTE — Progress Notes (Signed)
Subjective:   Patient ID: Jacob Cabrera, male   DOB: 50 y.o.   MRN: [id number]   HPI Patient presents stating he has developed a lot of pain in the outside of his left ankle and it is increasingly hard for him to walk with degree of comfort and states it is become gradually more of an issue for him   ROS      Objective:  Physical Exam  Vascular status intact with exquisite discomfort posterior lateral aspect left heel near its insertion calcaneus with no central or medial involvement and good strength of the posterior tibial and the Achilles tendon noted currently     Assessment:  Acute Achilles tendinitis left lateral side     Plan:  Reviewed condition and at this point I have recommended careful injection treatment explaining risk of rupture and complete immobilization.  Patient wants to undergo this approach and today I did sterile prep and injected the lateral side of the Achilles 3 mg dexamethasone Kenalog 5 mg Xylocaine and I applied air fracture walker to completely immobilize and rest the posterior heel with instructions on usage  X-rays indicated small spur no indication stress fracture arthritis at this point

## 2020-07-24 ENCOUNTER — Ambulatory Visit: Payer: PRIVATE HEALTH INSURANCE | Admitting: Podiatry

## 2020-07-24 ENCOUNTER — Encounter: Payer: Self-pay | Admitting: Podiatry

## 2020-07-24 ENCOUNTER — Other Ambulatory Visit: Payer: Self-pay

## 2020-07-24 DIAGNOSIS — M7662 Achilles tendinitis, left leg: Secondary | ICD-10-CM

## 2020-07-24 DIAGNOSIS — M79672 Pain in left foot: Secondary | ICD-10-CM

## 2020-07-25 NOTE — Progress Notes (Signed)
Subjective:   Patient ID: Jacob Cabrera, male   DOB: 51 y.o.   MRN: [id number]   HPI Patient states doing much better stating that the pain has reduced in that area but that there is some discomfort in the foot   ROS      Objective:  Physical Exam  Neurovascular status was found to be intact with posterior heel pain which is improved with pain still present upon deep palpation.  Patient is found to have good strength of the Achilles and appears to have no indication of tear or nodular formation.  There is moderate foot pain still noted but that is probably compensatory for the other problem    Assessment:  Acute Achilles tendinitis which is improving quite a bit with foot pain which seems to be more low-grade and probably due to gait change      Plan:  H&P condition reviewed and I went ahead today and I advised on reduced air fracture walker continue oral medication topical medicine stretching exercises shoe gear modification and heel lift.  Also discussed orthotics and the value they may have for him and we will reevaluate see how he does and decide whether or not those will be necessary if symptoms were to persist or get worse again

## 2021-08-17 ENCOUNTER — Encounter: Payer: Self-pay | Admitting: Gastroenterology

## 2021-08-24 ENCOUNTER — Encounter: Payer: Self-pay | Admitting: Gastroenterology

## 2021-10-12 ENCOUNTER — Ambulatory Visit (AMBULATORY_SURGERY_CENTER): Payer: No Typology Code available for payment source | Admitting: *Deleted

## 2021-10-12 VITALS — Ht 72.0 in | Wt 272.0 lb

## 2021-10-12 DIAGNOSIS — Z1211 Encounter for screening for malignant neoplasm of colon: Secondary | ICD-10-CM

## 2021-10-12 MED ORDER — NA SULFATE-K SULFATE-MG SULF 17.5-3.13-1.6 GM/177ML PO SOLN: 1.0000 | Freq: Once | ORAL | 0 refills | Status: AC

## 2021-10-12 NOTE — Progress Notes (Signed)

## 2021-10-27 ENCOUNTER — Encounter: Payer: Self-pay | Admitting: Gastroenterology

## 2021-11-02 ENCOUNTER — Encounter: Payer: Self-pay | Admitting: Gastroenterology

## 2021-11-02 ENCOUNTER — Ambulatory Visit (AMBULATORY_SURGERY_CENTER): Payer: No Typology Code available for payment source | Admitting: Gastroenterology

## 2021-11-02 VITALS — BP 100/58 | HR 70 | Temp 98.0°F | Resp 15 | Ht 72.0 in | Wt 272.0 lb

## 2021-11-02 DIAGNOSIS — D122 Benign neoplasm of ascending colon: Secondary | ICD-10-CM | POA: Diagnosis not present

## 2021-11-02 DIAGNOSIS — D128 Benign neoplasm of rectum: Secondary | ICD-10-CM

## 2021-11-02 DIAGNOSIS — Z1211 Encounter for screening for malignant neoplasm of colon: Secondary | ICD-10-CM | POA: Diagnosis present

## 2021-11-02 DIAGNOSIS — K621 Rectal polyp: Secondary | ICD-10-CM

## 2021-11-02 MED ORDER — SODIUM CHLORIDE 0.9 % IV SOLN: 500.0000 mL | Freq: Once | INTRAVENOUS | Status: DC

## 2021-11-02 NOTE — Progress Notes (Signed)
History and Physical: ? This patient presents for endoscopic testing for: ?Encounter Diagnosis  ?Name Primary?  ? Special screening for malignant neoplasms, colon Yes  ? ? ?Occasional HR bleeding, more so since constipation from GLP-a agonist.  Takes senokot -S daily ? ?Patient is otherwise without complaints or active issues today. ? ? ?Past Medical History: ?Past Medical History:  ?Diagnosis Date  ? Allergy   ? SEASONAL  ? Anxiety   ? Arthritis   ? HEELS,BILATERAL  ? Asthma   ? Hyperlipidemia   ? 20 YEARS AGO  ? Hypertension   ? Kidney stone   ? Rapid heart rate   ? Sleep apnea   ? C PAP  ? ? ? ?Past Surgical History: ?Past Surgical History:  ?Procedure Laterality Date  ? COLONOSCOPY    ? VASECTOMY  06/21/2003  ? WISDOM TOOTH EXTRACTION    ? ? ?Allergies: ?Allergies  ?Allergen Reactions  ? Penicillins   ?  Unspecified, as child  ? Phentermine Hcl   ?  Other reaction(s): ineffective  ? ? ?Outpatient Meds: ?Current Outpatient Medications  ?Medication Sig Dispense Refill  ? allopurinol (ZYLOPRIM) 100 MG tablet Take 100 mg by mouth daily.    ? ALLOPURINOL PO Take 100 mg by mouth daily.     ? fish oil-omega-3 fatty acids 1000 MG capsule Take 2 g by mouth daily.    ? losartan-hydrochlorothiazide (HYZAAR) 50-12.5 MG tablet 1 tablet    ? metoprolol succinate (TOPROL-XL) 25 MG 24 hr tablet TAKE 1 TABLET BY MOUTH IN THE MORNING 30 tablet 11  ? Multiple Vitamin (MULTIVITAMIN ADULT) TABS See admin instructions.    ? PARoxetine (PAXIL-CR) 25 MG 24 hr tablet Take 1 tablet (25 mg total) by mouth every morning. 30 tablet 1  ? Blood Pressure Monitoring DEVI 1 Device by Does not apply route daily. 1 Device 0  ? LORazepam (ATIVAN) 1 MG tablet Take 1 tablet (1 mg total) by mouth 2 (two) times daily as needed for anxiety. (Patient not taking: Reported on 10/12/2021) 20 tablet 0  ? WEGOVY 0.25 MG/0.5ML SOAJ SMARTSIG:0.5 Milliliter(s) SUB-Q Once a Week    ? ?Current Facility-Administered Medications  ?Medication Dose Route Frequency  Provider Last Rate Last Admin  ? 0.9 %  sodium chloride infusion  500 mL Intravenous Once Doran Stabler, MD      ? ? ? ? ?___________________________________________________________________ ?Objective  ? ?Exam: ? ?BP 128/76   Pulse 83   Temp 98 ?F (36.7 ?C)   Ht 6' (1.829 m)   Wt 272 lb (123.4 kg)   SpO2 96%   BMI 36.89 kg/m?  ? ?CV: RRR without murmur, S1/S2 ?Resp: clear to auscultation bilaterally, normal RR and effort noted ?GI: soft, no tenderness, with active bowel sounds. ? ? ?Assessment: ?Encounter Diagnosis  ?Name Primary?  ? Special screening for malignant neoplasms, colon Yes  ? ? ? ?Plan: ?Colonoscopy ? The benefits and risks of the planned procedure were described in detail with the patient or (when appropriate) their health care proxy.  Risks were outlined as including, but not limited to, bleeding, infection, perforation, adverse medication reaction leading to cardiac or pulmonary decompensation, pancreatitis (if ERCP).  The limitation of incomplete mucosal visualization was also discussed.  No guarantees or warranties were given. ? ? ? ?The patient is appropriate for an endoscopic procedure in the ambulatory setting. ? ? - Wilfrid Lund, MD ? ? ? ? ?

## 2021-11-02 NOTE — Progress Notes (Signed)
Called to room to assist during endoscopic procedure.  Patient ID and intended procedure confirmed with present staff. Received instructions for my participation in the procedure from the performing physician.  

## 2021-11-02 NOTE — Patient Instructions (Signed)
Resume previous medications.  5 polyps removed and sent to pathology.  Await results for final recommendations.   ? ?Handouts on findings given to patient. (Polyps, diverticulosis, hemorrhoids)   ? ?YOU HAD AN ENDOSCOPIC PROCEDURE TODAY AT [facility]:   Refer to the procedure report that was given to you for any specific questions about what was found during the examination.  If the procedure report does not answer your questions, please call your gastroenterologist to clarify.  If you requested that your care partner not be given the details of your procedure findings, then the procedure report has been included in a sealed envelope for you to review at your convenience later. ? ?YOU SHOULD EXPECT: Some feelings of bloating in the abdomen. Passage of more gas than usual.  Walking can help get rid of the air that was put into your GI tract during the procedure and reduce the bloating. If you had a lower endoscopy (such as a colonoscopy or flexible sigmoidoscopy) you may notice spotting of blood in your stool or on the toilet paper. If you underwent a bowel prep for your procedure, you may not have a normal bowel movement for a few days. ? ?Please Note:  You might notice some irritation and congestion in your nose or some drainage.  This is from the oxygen used during your procedure.  There is no need for concern and it should clear up in a day or so. ? ?SYMPTOMS TO REPORT IMMEDIATELY: ? ?Following lower endoscopy (colonoscopy or flexible sigmoidoscopy): ? Excessive amounts of blood in the stool ? Significant tenderness or worsening of abdominal pains ? Swelling of the abdomen that is new, acute ? Fever of 100?F or higher ? ? ?For urgent or emergent issues, a gastroenterologist can be reached at any hour by calling [phone]. ?Do not use MyChart messaging for urgent concerns.  ? ? ?DIET:  We do recommend a small meal at first, but then you may proceed to your regular diet.  Drink plenty of  fluids but you should avoid alcoholic beverages for 24 hours. ? ?ACTIVITY:  You should plan to take it easy for the rest of today and you should NOT DRIVE or use heavy machinery until tomorrow (because of the sedation medicines used during the test).   ? ?FOLLOW UP: ?Our staff will call the number listed on your records 48-72 hours following your procedure to check on you and address any questions or concerns that you may have regarding the information given to you following your procedure. If we do not reach you, we will leave a message.  We will attempt to reach you two times.  During this call, we will ask if you have developed any symptoms of COVID 19. If you develop any symptoms (ie: fever, flu-like symptoms, shortness of breath, cough etc.) before then, please call [phone].  If you test positive for Covid 19 in the 2 weeks post procedure, please call and report this information to [place].   ? ?If any biopsies were taken you will be contacted by phone or by letter within the next 1-3 weeks.  Please call us at [phone] if you have not heard about the biopsies in 3 weeks.  ? ? ?SIGNATURES/CONFIDENTIALITY: ?You and/or your care partner have signed paperwork which will be entered into your electronic medical record.  These signatures attest to the fact that that the information above on your After Visit Summary has been reviewed and is understood.  Full responsibility  of the confidentiality of this discharge information lies with you and/or your care-partner.  ?

## 2021-11-02 NOTE — Op Note (Signed)
[facility] ?Patient Name: Jacob Cabrera ?Procedure Date: 11/02/2021 1:48 PM ?MRN: [id number] ?Endoscopist: Estill Cotta. Loletha Carrow , MD ?Age: 51 ?Referring MD:  ?Date of Birth: 10-21-1970 ?Gender: Male ?Account #: [id number] ?Procedure:                Colonoscopy ?Indications:              Screening for colorectal malignant neoplasm ?                          Last colonoscopy 2012 for Fam Hx colon polyps ?Medicines:                Monitored Anesthesia Care ?Procedure:                Pre-Anesthesia Assessment: ?                          - Prior to the procedure, a History and Physical  ?                          was performed, and patient medications and  ?                          allergies were reviewed. The patient's tolerance of  ?                          previous anesthesia was also reviewed. The risks  ?                          and benefits of the procedure and the sedation  ?                          options and risks were discussed with the patient.  ?                          All questions were answered, and informed consent  ?                          was obtained. Prior Anticoagulants: The patient has  ?                          taken no previous anticoagulant or antiplatelet  ?                          agents. ASA Grade Assessment: III - A patient with  ?                          severe systemic disease. After reviewing the risks  ?                          and benefits, the patient was deemed in  ?                          satisfactory condition to undergo the procedure. ?  After obtaining informed consent, the colonoscope  ?                          was passed under direct vision. Throughout the  ?                          procedure, the patient's blood pressure, pulse, and  ?                          oxygen saturations were monitored continuously. The  ?                          Olympus CF-[id number] (#[id number]) Colonoscope was  ?                          introduced through the anus  and advanced to the the  ?                          cecum, identified by appendiceal orifice and  ?                          ileocecal valve. The colonoscopy was performed  ?                          without difficulty. The patient tolerated the  ?                          procedure well. The quality of the bowel  ?                          preparation was excellent. The ileocecal valve,  ?                          appendiceal orifice, and rectum were photographed. ?Scope In: 2:05:17 PM ?Scope Out: 2:24:11 PM ?Scope Withdrawal Time: 0 hours 16 minutes 10 seconds  ?Total Procedure Duration: 0 hours 18 minutes 54 seconds  ?Findings:                 The perianal and digital rectal examinations were  ?                          normal. ?                          Two sessile polyps were found in the ascending  ?                          colon. The polyps were diminutive in size. These  ?                          polyps were removed with a cold snare. Resection  ?                          and retrieval were complete. ?  Multiple diverticula were found in the left colon  ?                          and right colon. ?                          Repeat examination of right colon under NBI  ?                          performed. ?                          Three small (4-36m), mobile polypoid lesions were  ?                          found in the mid to distal rectum. The lesions were  ?                          submucosal. The polyp was removed with a hot snare.  ?                          Resection and retrieval were complete. ?                          Internal hemorrhoids were found. ?                          The exam was otherwise without abnormality on  ?                          direct and retroflexion views. ?Complications:            No immediate complications. ?Estimated Blood Loss:     Estimated blood loss was minimal. ?Impression:               - Two diminutive polyps in the ascending colon,  ?                           removed with a cold snare. Resected and retrieved. ?                          - Diverticulosis in the left colon and in the right  ?                          colon. ?                          - Polypoid lesion in the rectum. Complete removal  ?                          was accomplished. ?                          - Internal hemorrhoids. ?                          -  The examination was otherwise normal on direct  ?                          and retroflexion views. ?Recommendation:           - Patient has a contact number available for  ?                          emergencies. The signs and symptoms of potential  ?                          delayed complications were discussed with the  ?                          patient. Return to normal activities tomorrow.  ?                          Written discharge instructions were provided to the  ?                          patient. ?                          - Resume previous diet. ?                          - Continue present medications. ?                          - Await pathology results. ?                          - Repeat colonoscopy is recommended for  ?                          surveillance. The colonoscopy date will be  ?                          determined after pathology results from today's  ?                          exam become available for review. ?Tranika Scholler L. Loletha Carrow, MD ?11/02/2021 2:30:43 PM ?This report has been signed electronically. ?

## 2021-11-02 NOTE — Progress Notes (Signed)
Pt non-responsive, VVS, Report to RN  °

## 2021-11-02 NOTE — Progress Notes (Signed)
Pt's states no medical or surgical changes since previsit or office visit. 

## 2021-11-04 ENCOUNTER — Telehealth: Payer: Self-pay

## 2021-11-04 ENCOUNTER — Telehealth: Payer: Self-pay | Admitting: *Deleted

## 2021-11-04 NOTE — Telephone Encounter (Signed)
First follow up call attempt.  LVM. 

## 2021-11-04 NOTE — Telephone Encounter (Signed)
Second  attempt follow up call to pt, lm for pt to call if having any problems or questions.

## 2021-11-09 ENCOUNTER — Encounter: Payer: Self-pay | Admitting: Gastroenterology

## 2022-07-01 ENCOUNTER — Other Ambulatory Visit (HOSPITAL_COMMUNITY): Payer: Self-pay

## 2022-07-01 DIAGNOSIS — E7800 Pure hypercholesterolemia, unspecified: Secondary | ICD-10-CM

## 2022-07-11 ENCOUNTER — Ambulatory Visit (HOSPITAL_COMMUNITY)
Admission: RE | Admit: 2022-07-11 | Discharge: 2022-07-11 | Disposition: A | Discharge status: Home / Self Care | Payer: No Typology Code available for payment source | Source: Ambulatory Visit

## 2022-07-11 DIAGNOSIS — E7800 Pure hypercholesterolemia, unspecified: Secondary | ICD-10-CM | POA: Insufficient documentation

## 2022-07-19 ENCOUNTER — Ambulatory Visit: Payer: No Typology Code available for payment source | Admitting: Internal Medicine

## 2022-07-19 ENCOUNTER — Encounter: Payer: Self-pay | Admitting: Internal Medicine

## 2022-07-19 VITALS — BP 131/86 | HR 75 | Ht 72.0 in | Wt 260.0 lb

## 2022-07-19 DIAGNOSIS — R931 Abnormal findings on diagnostic imaging of heart and coronary circulation: Secondary | ICD-10-CM

## 2022-07-19 DIAGNOSIS — I10 Essential (primary) hypertension: Secondary | ICD-10-CM

## 2022-07-19 DIAGNOSIS — E782 Mixed hyperlipidemia: Secondary | ICD-10-CM

## 2022-07-19 DIAGNOSIS — R0609 Other forms of dyspnea: Secondary | ICD-10-CM

## 2022-07-19 NOTE — Progress Notes (Signed)
Primary Physician/Referring:  Deon Pilling, NP  Patient ID: Jacob Cabrera, male    DOB: [birth year], 52 y.o.   MRN: [id number]  Chief Complaint  Patient presents with   agatson CAC score >400   New Patient (Initial Visit)   HPI:    [name]  is a 52 y.o. male with past medical history significant for hypertension and hyperlipidemia who is here to establish care with cardiology.  Patient recently underwent coronary artery calcium scoring which was significantly elevated placing him at high risk category for future heart disease.  Patient denies any cardiac history in himself.  He does admit to dyspnea on exertion but he thinks this is just from working out.  He was on [facility] for weight loss but he plateaued and it was not working anymore so he has been taken off of that.  Patient is interested in trying another injectable for weight loss.  He denies chest pain, palpitations, diaphoresis, syncope, edema, orthopnea, PND, claudication.  Past Medical History:  Diagnosis Date   Allergy    SEASONAL   Anxiety    Arthritis    HEELS,BILATERAL   Asthma    Hyperlipidemia    20 YEARS AGO   Hypertension    Kidney stone    Rapid heart rate    Sleep apnea    C PAP   Past Surgical History:  Procedure Laterality Date   COLONOSCOPY     VASECTOMY  06/21/2003   WISDOM TOOTH EXTRACTION     Family History  Problem Relation Age of Onset   Colon polyps Mother 70   Memory loss Father    Dementia Father    Depression Maternal Grandmother    Colon cancer Maternal Grandfather 17   Mental illness Paternal Grandmother    Dementia Paternal Grandfather    Crohn's disease Neg Hx    Esophageal cancer Neg Hx    Rectal cancer Neg Hx    Stomach cancer Neg Hx     Social History   Tobacco Use   Smoking status: Never   Smokeless tobacco: Never  Substance Use Topics   Alcohol use: Yes    Comment: occasional   Marital Status: Married  ROS  Review of Systems  Cardiovascular:  Positive for dyspnea  on exertion.   Objective  Blood pressure 131/86, pulse 75, height 6' (1.829 m), weight 260 lb (117.9 kg), SpO2 98 %. Body mass index is 35.26 kg/m.     07/19/2022    2:17 PM 11/02/2021    2:48 PM 11/02/2021    2:38 PM  Vitals with BMI  Height '6\' 0"'$     Weight 260 lbs    BMI 11.65    Systolic 790 383 338  Diastolic 86 58 57  Pulse 75 70 72     Physical Exam Vitals reviewed.  HENT:     Head: Normocephalic and atraumatic.  Cardiovascular:     Rate and Rhythm: Normal rate and regular rhythm.     Pulses: Normal pulses.     Heart sounds: Normal heart sounds. No murmur heard. Pulmonary:     Effort: Pulmonary effort is normal.     Breath sounds: Normal breath sounds.  Abdominal:     General: Bowel sounds are normal.  Musculoskeletal:     Right lower leg: No edema.     Left lower leg: No edema.  Skin:    General: Skin is warm and dry.  Neurological:     Mental Status: He is  alert.     Medications and allergies   Allergies  Allergen Reactions   Phentermine Hcl     Other reaction(s): ineffective   Penicillins Hives    Unspecified, as child     Medication list after today's encounter   Current Outpatient Medications:    allopurinol (ZYLOPRIM) 100 MG tablet, Take 100 mg by mouth daily., Disp: , Rfl:    Blood Pressure Monitoring DEVI, 1 Device by Does not apply route daily., Disp: 1 Device, Rfl: 0   fish oil-omega-3 fatty acids 1000 MG capsule, Take 2 g by mouth daily., Disp: , Rfl:    LORazepam (ATIVAN) 1 MG tablet, Take 1 tablet (1 mg total) by mouth 2 (two) times daily as needed for anxiety., Disp: 20 tablet, Rfl: 0   losartan-hydrochlorothiazide (HYZAAR) 50-12.5 MG tablet, 1 tablet, Disp: , Rfl:    metoprolol succinate (TOPROL-XL) 25 MG 24 hr tablet, TAKE 1 TABLET BY MOUTH IN THE MORNING, Disp: 30 tablet, Rfl: 11   Multiple Vitamin (MULTIVITAMIN ADULT) TABS, See admin instructions., Disp: , Rfl:    PARoxetine (PAXIL-CR) 25 MG 24 hr tablet, Take 1 tablet (25 mg total) by  mouth every morning., Disp: 30 tablet, Rfl: 1   rosuvastatin (CRESTOR) 10 MG tablet, Take 10 mg by mouth daily., Disp: , Rfl:    senna (SENOKOT) 8.6 MG TABS tablet, Take 1 tablet by mouth., Disp: , Rfl:    WEGOVY 2.4 MG/0.75ML SOAJ, Inject 2.4 mg into the skin once a week., Disp: , Rfl:   Laboratory examination:   Lab Results  Component Value Date   NA 140 04/16/2014   K 4.1 04/16/2014   CO2 24 04/16/2014   GLUCOSE 96 04/16/2014   BUN 12 04/16/2014   CREATININE 0.77 04/16/2014   CALCIUM 9.2 04/16/2014   GFRNONAA 86 (L) 09/08/2012       Latest Ref Rng & Units 04/16/2014   10:18 AM 10/02/2013    8:41 AM 04/03/2013    9:05 AM  CMP  Glucose 70 - 99 mg/dL 96  88  96   BUN 6 - 23 mg/dL '12  19  16   '$ Creatinine 0.50 - 1.35 mg/dL 0.77  0.78  0.81   Sodium 135 - 145 mEq/L 140  140  140   Potassium 3.5 - 5.3 mEq/L 4.1  3.9  4.0   Chloride 96 - 112 mEq/L 106  103  105   CO2 19 - 32 mEq/L '24  24  25   '$ Calcium 8.4 - 10.5 mg/dL 9.2  9.2  9.2   Total Protein 6.0 - 8.3 g/dL   7.4   Total Bilirubin 0.3 - 1.2 mg/dL   0.8   Alkaline Phos 39 - 117 U/L   50   AST 0 - 37 U/L   17   ALT 0 - 53 U/L   33       Latest Ref Rng & Units 09/08/2012    6:33 PM  CBC  WBC 4.0 - 10.5 K/uL 12.1   Hemoglobin 13.0 - 17.0 g/dL 14.8   Hematocrit 39.0 - 52.0 % 42.4   Platelets 150 - 400 K/uL 214     Lipid Panel No results for input(s): "CHOL", "TRIG", "[place]", "VLDL", "HDL", "CHOLHDL", "LDLDIRECT" in the last 8760 hours.  HEMOGLOBIN A1C Lab Results  Component Value Date   HGBA1C 5.0 04/16/2014   TSH No results for input(s): "TSH" in the last 8760 hours.  External labs:  Labs done last week, awaiting records  Radiology:    Cardiac Studies:   07/11/2022 CACS FINDINGS: Coronary arteries: Normal origins. Coronary Calcium Score: Left main: 0 Left anterior descending artery: 260 Left circumflex artery: 117 Right coronary artery: 138 Total: 515 Percentile: 98th     EKG:    07/19/2022: normal sinus rhythm with incomplete RBBB  Assessment     ICD-10-CM   1. Agatston CAC score, >400  R93.1 EKG 12-Lead    PCV ECHOCARDIOGRAM COMPLETE    PCV MYOCARDIAL PERFUSION WO LEXISCAN    2. Essential hypertension  I10 PCV ECHOCARDIOGRAM COMPLETE    PCV MYOCARDIAL PERFUSION WO LEXISCAN    3. Mixed hyperlipidemia  E78.2 PCV ECHOCARDIOGRAM COMPLETE    PCV MYOCARDIAL PERFUSION WO LEXISCAN    4. DOE (dyspnea on exertion)  R06.09 PCV ECHOCARDIOGRAM COMPLETE    PCV MYOCARDIAL PERFUSION WO LEXISCAN       Orders Placed This Encounter  Procedures   PCV MYOCARDIAL PERFUSION WO LEXISCAN    Standing Status:   Future    Standing Expiration Date:   09/17/2022   EKG 12-Lead   PCV ECHOCARDIOGRAM COMPLETE    Standing Status:   Future    Standing Expiration Date:   07/20/2023    No orders of the defined types were placed in this encounter.   Medications Discontinued During This Encounter  Medication Reason   WEGOVY 0.25 MG/0.5ML SOAJ    ALLOPURINOL PO Completed Course   celecoxib (CELEBREX) 200 MG capsule Completed Course     Recommendations:   GAGAN DILLION is a 52 y.o.  male with elevated CACS  Agatston CAC score, >400 Will obtain stress test for further risk stratification   Essential hypertension Continue current cardiac medications. Encourage low-sodium diet, less than 2000 mg daily. Follow-up in 1-2 months or sooner if needed.   Mixed hyperlipidemia Continue Crestor 10 mg daily Will increase depending on LDL results   DOE (dyspnea on exertion) Echocardiogram ordered     Floydene Flock, DO,  [facility]  07/19/2022, 3:06 PM Office: [phone] Pager: [phone]

## 2022-07-27 ENCOUNTER — Ambulatory Visit: Payer: No Typology Code available for payment source

## 2022-07-27 DIAGNOSIS — E782 Mixed hyperlipidemia: Secondary | ICD-10-CM

## 2022-07-27 DIAGNOSIS — R0609 Other forms of dyspnea: Secondary | ICD-10-CM

## 2022-07-27 DIAGNOSIS — I10 Essential (primary) hypertension: Secondary | ICD-10-CM

## 2022-07-27 DIAGNOSIS — R931 Abnormal findings on diagnostic imaging of heart and coronary circulation: Secondary | ICD-10-CM

## 2022-07-31 NOTE — Progress Notes (Signed)
normal

## 2022-08-01 NOTE — Progress Notes (Signed)
Called and spoke with patient regarding his echocardiogram results.  ?

## 2022-08-15 ENCOUNTER — Ambulatory Visit: Payer: No Typology Code available for payment source

## 2022-08-15 DIAGNOSIS — I10 Essential (primary) hypertension: Secondary | ICD-10-CM

## 2022-08-15 DIAGNOSIS — E782 Mixed hyperlipidemia: Secondary | ICD-10-CM

## 2022-08-15 DIAGNOSIS — R0609 Other forms of dyspnea: Secondary | ICD-10-CM

## 2022-08-15 DIAGNOSIS — R931 Abnormal findings on diagnostic imaging of heart and coronary circulation: Secondary | ICD-10-CM

## 2022-08-18 NOTE — Progress Notes (Signed)
normal

## 2022-08-23 NOTE — Progress Notes (Signed)
LMTCB

## 2022-08-24 NOTE — Progress Notes (Signed)
Called and spoke with patient regarding his stress test results.

## 2022-09-02 ENCOUNTER — Ambulatory Visit: Payer: No Typology Code available for payment source | Admitting: Internal Medicine

## 2022-09-04 NOTE — Progress Notes (Signed)
Rescheduled

## 2022-11-07 ENCOUNTER — Encounter: Payer: Self-pay | Admitting: Internal Medicine

## 2022-11-07 ENCOUNTER — Ambulatory Visit: Payer: No Typology Code available for payment source | Admitting: Internal Medicine

## 2022-11-07 VITALS — BP 120/74 | HR 65 | Ht 72.0 in | Wt 280.0 lb

## 2022-11-07 DIAGNOSIS — E782 Mixed hyperlipidemia: Secondary | ICD-10-CM

## 2022-11-07 DIAGNOSIS — I10 Essential (primary) hypertension: Secondary | ICD-10-CM

## 2022-11-07 DIAGNOSIS — R931 Abnormal findings on diagnostic imaging of heart and coronary circulation: Secondary | ICD-10-CM

## 2022-11-07 MED ORDER — ROSUVASTATIN CALCIUM 20 MG PO TABS: 20.0000 mg | ORAL_TABLET | Freq: Every day | ORAL | 3 refills | Status: AC

## 2022-11-07 NOTE — Progress Notes (Signed)
Primary Physician/Referring:  Linus Galas, NP  Patient ID: Jacob Cabrera, male    DOB: 1971-02-05, 52 y.o.   MRN: [id number]  Chief Complaint  Patient presents with   Hypertension   Follow-up   HPI:    Jacob Cabrera  is a 52 y.o. male with past medical history significant for hypertension and hyperlipidemia who is here for a follow-up visit. He has been doing well since the last time he was here. He does still have shortness of breath with activity but he thinks it is due to deconditioning. He is happy his cardiac tests came back normal. He had his cholesterol checked with his PCP recently and it was quite high. Patient is agreeable to increasing his dose of crestor. He denies chest pain, palpitations, diaphoresis, syncope, edema, orthopnea, PND, claudication.  Past Medical History:  Diagnosis Date   Allergy    SEASONAL   Anxiety    Arthritis    HEELS,BILATERAL   Asthma    Hyperlipidemia    20 YEARS AGO   Hypertension    Kidney stone    Rapid heart rate    Sleep apnea    C PAP   Past Surgical History:  Procedure Laterality Date   COLONOSCOPY     VASECTOMY  06/21/2003   WISDOM TOOTH EXTRACTION     Family History  Problem Relation Age of Onset   Colon polyps Mother 58   Memory loss Father    Dementia Father    Depression Maternal Grandmother    Colon cancer Maternal Grandfather 57   Mental illness Paternal Grandmother    Dementia Paternal Grandfather    Crohn's disease Neg Hx    Esophageal cancer Neg Hx    Rectal cancer Neg Hx    Stomach cancer Neg Hx     Social History   Tobacco Use   Smoking status: Never   Smokeless tobacco: Never  Substance Use Topics   Alcohol use: Yes    Comment: occasional   Marital Status: Married  ROS  Review of Systems  Cardiovascular:  Positive for dyspnea on exertion.   Objective  Blood pressure 120/74, pulse 65, height 6' (1.829 m), weight 280 lb (127 kg), SpO2 95 %. Body mass index is 37.97 kg/m.     11/07/2022   10:37 AM  07/19/2022    2:17 PM 11/02/2021    2:48 PM  Vitals with BMI  Height 6\' 0"  6\' 0"    Weight 280 lbs 260 lbs   BMI 37.97 35.25   Systolic 120 131 409  Diastolic 74 86 58  Pulse 65 75 70     Physical Exam Vitals reviewed.  HENT:     Head: Normocephalic and atraumatic.  Cardiovascular:     Rate and Rhythm: Normal rate and regular rhythm.     Pulses: Normal pulses.     Heart sounds: Normal heart sounds. No murmur heard. Pulmonary:     Effort: Pulmonary effort is normal.     Breath sounds: Normal breath sounds.  Abdominal:     General: Bowel sounds are normal.  Musculoskeletal:     Right lower leg: No edema.     Left lower leg: No edema.  Skin:    General: Skin is warm and dry.  Neurological:     Mental Status: He is alert.     Medications and allergies   Allergies  Allergen Reactions   Phentermine Hcl     Other reaction(s): ineffective   Penicillins Hives  Unspecified, as child     Medication list after today's encounter   Current Outpatient Medications:    allopurinol (ZYLOPRIM) 100 MG tablet, Take 100 mg by mouth daily., Disp: , Rfl:    Blood Pressure Monitoring DEVI, 1 Device by Does not apply route daily., Disp: 1 Device, Rfl: 0   celecoxib (CELEBREX) 200 MG capsule, Take 200 mg by mouth daily., Disp: , Rfl:    fish oil-omega-3 fatty acids 1000 MG capsule, Take 2 g by mouth daily., Disp: , Rfl:    LORazepam (ATIVAN) 1 MG tablet, Take 1 tablet (1 mg total) by mouth 2 (two) times daily as needed for anxiety., Disp: 20 tablet, Rfl: 0   losartan-hydrochlorothiazide (HYZAAR) 50-12.5 MG tablet, 1 tablet, Disp: , Rfl:    metoprolol succinate (TOPROL-XL) 25 MG 24 hr tablet, TAKE 1 TABLET BY MOUTH IN THE MORNING, Disp: 30 tablet, Rfl: 11   Multiple Vitamin (MULTIVITAMIN ADULT) TABS, See admin instructions., Disp: , Rfl:    PARoxetine (PAXIL-CR) 25 MG 24 hr tablet, Take 1 tablet (25 mg total) by mouth every morning., Disp: 30 tablet, Rfl: 1   senna (SENOKOT) 8.6 MG TABS  tablet, Take 1 tablet by mouth., Disp: , Rfl:    rosuvastatin (CRESTOR) 20 MG tablet, Take 1 tablet (20 mg total) by mouth daily., Disp: 90 tablet, Rfl: 3  Laboratory examination:   Lab Results  Component Value Date   NA 140 04/16/2014   K 4.1 04/16/2014   CO2 24 04/16/2014   GLUCOSE 96 04/16/2014   BUN 12 04/16/2014   CREATININE 0.77 04/16/2014   CALCIUM 9.2 04/16/2014   GFRNONAA 86 (L) 09/08/2012       Latest Ref Rng & Units 04/16/2014   10:18 AM 10/02/2013    8:41 AM 04/03/2013    9:05 AM  CMP  Glucose 70 - 99 mg/dL 96  88  96   BUN 6 - 23 mg/dL 12  19  16    Creatinine 0.50 - 1.35 mg/dL 3.08  6.57  8.46   Sodium 135 - 145 mEq/L 140  140  140   Potassium 3.5 - 5.3 mEq/L 4.1  3.9  4.0   Chloride 96 - 112 mEq/L 106  103  105   CO2 19 - 32 mEq/L 24  24  25    Calcium 8.4 - 10.5 mg/dL 9.2  9.2  9.2   Total Protein 6.0 - 8.3 g/dL   7.4   Total Bilirubin 0.3 - 1.2 mg/dL   0.8   Alkaline Phos 39 - 117 U/L   50   AST 0 - 37 U/L   17   ALT 0 - 53 U/L   33       Latest Ref Rng & Units 09/08/2012    6:33 PM  CBC  WBC 4.0 - 10.5 K/uL 12.1   Hemoglobin 13.0 - 17.0 g/dL 96.2   Hematocrit 95.2 - 52.0 % 42.4   Platelets 150 - 400 K/uL 214     Lipid Panel No results for input(s): "CHOL", "TRIG", "LDLCALC", "VLDL", "HDL", "CHOLHDL", "LDLDIRECT" in the last 8760 hours.  HEMOGLOBIN A1C Lab Results  Component Value Date   HGBA1C 5.0 04/16/2014   TSH No results for input(s): "TSH" in the last 8760 hours.  External labs:  Labs done last week, awaiting records   Radiology:    Cardiac Studies:   07/11/2022 CACS FINDINGS: Coronary arteries: Normal origins. Coronary Calcium Score: Left main: 0 Left anterior descending artery: 260 Left circumflex  artery: 117 Right coronary artery: 138 Total: 515 Percentile: 98th   Echocardiogram 07/27/2022: Normal LV systolic function with visual EF 60-65%. Left ventricle cavity is normal in size. Normal global wall motion. Normal  diastolic filling pattern, normal LAP. Mild left ventricular hypertrophy. No significant valvular heart disease. No prior study for comparison.    Exercise Sestamibi stress test 08/15/2022: Exercise nuclear stress test was performed using Bruce protocol. Patient reached 7 METS, and 89% of age predicted maximum heart rate. Exercise capacity was low. No chest pain reported. Heart rate and hemodynamic response were normal. Stress EKG revealed no ischemic changes. SPECT images show minimal apical thinning without definite evidence of ischemia/infarct. Stress LVEF 53%. Low risk study.   EKG:   07/19/2022: normal sinus rhythm with incomplete RBBB  Assessment     ICD-10-CM   1. Essential hypertension  I10 Lipid Panel With LDL/HDL Ratio    2. Agatston CAC score, >400  R93.1 Lipid Panel With LDL/HDL Ratio    3. Mixed hyperlipidemia  E78.2 Lipid Panel With LDL/HDL Ratio       Orders Placed This Encounter  Procedures   Lipid Panel With LDL/HDL Ratio    Meds ordered this encounter  Medications   rosuvastatin (CRESTOR) 20 MG tablet    Sig: Take 1 tablet (20 mg total) by mouth daily.    Dispense:  90 tablet    Refill:  3    Medications Discontinued During This Encounter  Medication Reason   WEGOVY 2.4 MG/0.75ML SOAJ Completed Course   rosuvastatin (CRESTOR) 10 MG tablet Reorder     Recommendations:   Jacob Cabrera is a 52 y.o.  male with elevated CACS  Agatston CAC score, >400 Stress test and echo are negative for ischemia Will increase Crestor to 20 mg daily   Essential hypertension Continue current cardiac medications. Encourage low-sodium diet, less than 2000 mg daily. Follow-up in 6 months or sooner if needed.   Mixed hyperlipidemia Will increase to Crestor 20 mg based off lipid panel done at his PCP office in January We will repeat lipid panel in 4-6 weeks after being on the higher dose of Crestor     Clotilde Dieter, DO, [facility]  11/07/2022, 1:46 PM Office:  [phone] Pager: [phone]

## 2022-11-30 ENCOUNTER — Ambulatory Visit: Payer: No Typology Code available for payment source | Admitting: Internal Medicine

## 2023-05-09 ENCOUNTER — Other Ambulatory Visit: Payer: Self-pay | Admitting: Medical Genetics

## 2023-05-09 DIAGNOSIS — Z006 Encounter for examination for normal comparison and control in clinical research program: Secondary | ICD-10-CM

## 2023-06-07 ENCOUNTER — Other Ambulatory Visit (HOSPITAL_COMMUNITY)
Admission: RE | Admit: 2023-06-07 | Discharge: 2023-06-07 | Disposition: A | Discharge status: Home / Self Care | Payer: Self-pay | Source: Ambulatory Visit | Attending: Oncology | Admitting: Oncology

## 2023-06-07 DIAGNOSIS — Z006 Encounter for examination for normal comparison and control in clinical research program: Secondary | ICD-10-CM

## 2023-06-13 LAB — GENECONNECT MOLECULAR SCREEN: Genetic Analysis Overall Interpretation: NEGATIVE

## 2023-07-04 ENCOUNTER — Ambulatory Visit: Payer: No Typology Code available for payment source | Admitting: Audiologist

## 2023-07-04 DIAGNOSIS — H903 Sensorineural hearing loss, bilateral: Secondary | ICD-10-CM | POA: Insufficient documentation

## 2023-07-04 NOTE — Procedures (Signed)
  Outpatient Audiology and [facility] [address] [place], [place]  [address] [phone]  AUDIOLOGICAL  EVALUATION  NAME: Jacob Cabrera     DOB:   03/06/1971      MRN: [id number]                                                                                     DATE: 07/04/2023     REFERENT: Corlis Pagan, NP STATUS: Outpatient DIAGNOSIS: Asymmetric Sloping Sensorineural Hearing Loss    History: Jacob Cabrera was seen for an audiological evaluation due to a perceived progression in his hearing loss. Jacob Cabrera cannot hear his wife and daughter even with his hearing aids in. He has semi open domes on Resound hearing aids. He has medium power receivers bilaterally.  He feels the hearing loss has progressed significantly in the last year. Jacob Cabrera denies pain or pressure in either ear. His ears have been itching. His tinnitus has progressed over the last year as well. He is not sure what has caused this change. He has had hearing loss for a while, but this progression is new.    Evaluation:  Otoscopy showed a clear view of the tympanic membranes, bilaterally Tympanometry results were consistent with normal middle ear function, bilaterally   Audiometric testing was completed using Conventional Audiometry techniques with insert earphones and supraural headphones. Test results are consistent with normal hearing sloping steeply to severe loss bilaterally. Slight asymmetry present with right ear worse 500-1.5kHz. Speech Recognition Thresholds were obtained at 40 dB HL in the right ear and at 30 dB HL in the left ear. Word Recognition Testing was completed at  40dB SL and Jacob Cabrera scored 80% in the right ear and 92% in the left ear.   Results:  The test results were reviewed with Jacob Cabrera. Jacob Cabrera has a sloping sensorineural hearing loss with a severe high frequency loss. The right ear is 10-20dB worse 500-1.5kHz.  Audiogram copies printed and provided to Jacob Cabrera.     Recommendations: Hearing aid check  recommended for both ears. If not already performed patient may benefit from real ear verification to make sure domes are sufficient.  Annual audiometric testing recommended to monitor hearing loss for progression.  Recommend referral to Otolaryngology due to asymmetric hearing loss with right ear worse.    34 minutes spent testing and counseling on results.   If you have any questions please feel free to contact me at [phone].  Jacob Cabrera Au.D.  Audiologist   07/04/2023  3:33 PM  Cc: Corlis Pagan, NP

## 2023-11-07 ENCOUNTER — Ambulatory Visit: Payer: No Typology Code available for payment source | Admitting: Cardiology
